# Patient Record
Sex: Female | Born: 1950
Health system: Southern US, Community
[De-identification: ages and names within clinical notes are randomized; demographics above are authoritative.]

## PROBLEM LIST (undated history)

## (undated) DIAGNOSIS — E039 Hypothyroidism, unspecified: Secondary | ICD-10-CM

## (undated) DIAGNOSIS — E079 Disorder of thyroid, unspecified: Secondary | ICD-10-CM

## (undated) DIAGNOSIS — I10 Essential (primary) hypertension: Secondary | ICD-10-CM

## (undated) HISTORY — PX: THYROID SURGERY: SHX805

## (undated) HISTORY — DX: Essential (primary) hypertension: I10

## (undated) HISTORY — PX: CERVICAL CONE BIOPSY: SUR198

## (undated) HISTORY — DX: Disorder of thyroid, unspecified: E07.9

---

## 2004-04-10 ENCOUNTER — Ambulatory Visit: Payer: Self-pay | Admitting: Obstetrics and Gynecology

## 2004-05-05 ENCOUNTER — Ambulatory Visit (HOSPITAL_COMMUNITY): Admission: RE | Admit: 2004-05-05 | Discharge: 2004-05-05 | Payer: Self-pay | Admitting: Neurosurgery

## 2004-05-25 ENCOUNTER — Emergency Department: Payer: Self-pay | Admitting: Emergency Medicine

## 2004-06-04 ENCOUNTER — Ambulatory Visit: Payer: Self-pay | Admitting: Family Medicine

## 2005-03-30 HISTORY — PX: BRAIN SURGERY: SHX531

## 2005-07-01 ENCOUNTER — Ambulatory Visit: Payer: Self-pay | Admitting: Obstetrics and Gynecology

## 2006-12-22 ENCOUNTER — Ambulatory Visit: Payer: Self-pay | Admitting: Obstetrics and Gynecology

## 2007-01-10 ENCOUNTER — Ambulatory Visit: Payer: Self-pay | Admitting: Obstetrics and Gynecology

## 2007-07-02 ENCOUNTER — Emergency Department: Payer: Self-pay | Admitting: Emergency Medicine

## 2008-02-15 ENCOUNTER — Ambulatory Visit: Payer: Self-pay | Admitting: Obstetrics and Gynecology

## 2009-02-05 ENCOUNTER — Ambulatory Visit: Payer: Self-pay | Admitting: Internal Medicine

## 2009-03-05 ENCOUNTER — Ambulatory Visit: Payer: Self-pay | Admitting: Obstetrics and Gynecology

## 2010-01-09 ENCOUNTER — Ambulatory Visit: Payer: Self-pay | Admitting: Obstetrics and Gynecology

## 2010-01-15 ENCOUNTER — Ambulatory Visit: Payer: Self-pay | Admitting: Neurology

## 2010-04-03 ENCOUNTER — Ambulatory Visit: Payer: Self-pay | Admitting: Obstetrics and Gynecology

## 2010-04-19 ENCOUNTER — Encounter: Payer: Self-pay | Admitting: Neurosurgery

## 2011-04-29 ENCOUNTER — Emergency Department: Payer: Self-pay | Admitting: Emergency Medicine

## 2011-04-29 LAB — CBC
HGB: 14 g/dL (ref 12.0–16.0)
MCH: 30 pg (ref 26.0–34.0)
Platelet: 214 10*3/uL (ref 150–440)
RBC: 4.65 10*6/uL (ref 3.80–5.20)
WBC: 4.7 10*3/uL (ref 3.6–11.0)

## 2011-04-29 LAB — COMPREHENSIVE METABOLIC PANEL
Bilirubin,Total: 0.4 mg/dL (ref 0.2–1.0)
Creatinine: 0.77 mg/dL (ref 0.60–1.30)
EGFR (African American): >60
EGFR (Non-African Amer.): >60
Glucose: 103 mg/dL — ABNORMAL HIGH (ref 65–99)
Osmolality: 284 (ref 275–301)
Potassium: 3.7 mmol/L (ref 3.5–5.1)
Sodium: 143 mmol/L (ref 136–145)
Total Protein: 8 g/dL (ref 6.4–8.2)

## 2011-04-29 LAB — URINALYSIS, COMPLETE
Bilirubin,UR: NEGATIVE
Ketone: NEGATIVE
Ph: 7 (ref 4.5–8.0)
WBC UR: 2 /HPF (ref 0–5)

## 2011-04-29 LAB — TROPONIN I: Troponin-I: <0.02 ng/mL

## 2011-04-29 LAB — LIPASE, BLOOD: Lipase: 138 U/L (ref 73–393)

## 2011-06-04 ENCOUNTER — Ambulatory Visit: Payer: Self-pay | Admitting: Obstetrics and Gynecology

## 2011-06-18 ENCOUNTER — Ambulatory Visit: Payer: Self-pay | Admitting: Obstetrics and Gynecology

## 2012-02-19 ENCOUNTER — Ambulatory Visit: Payer: Self-pay | Admitting: Internal Medicine

## 2012-03-28 ENCOUNTER — Ambulatory Visit: Payer: Self-pay | Admitting: Gastroenterology

## 2012-03-29 ENCOUNTER — Ambulatory Visit: Payer: Self-pay | Admitting: Gastroenterology

## 2012-03-31 LAB — PATHOLOGY REPORT

## 2012-05-05 ENCOUNTER — Ambulatory Visit: Payer: Self-pay | Admitting: Gastroenterology

## 2012-05-06 LAB — PATHOLOGY REPORT

## 2012-12-23 ENCOUNTER — Ambulatory Visit: Payer: Self-pay | Admitting: Gastroenterology

## 2013-01-02 ENCOUNTER — Ambulatory Visit: Payer: Self-pay | Admitting: Obstetrics and Gynecology

## 2013-02-09 ENCOUNTER — Ambulatory Visit: Payer: Self-pay | Admitting: Obstetrics and Gynecology

## 2013-09-07 ENCOUNTER — Ambulatory Visit: Payer: Self-pay | Admitting: Obstetrics and Gynecology

## 2013-10-30 ENCOUNTER — Emergency Department: Payer: Self-pay | Admitting: Emergency Medicine

## 2013-10-30 LAB — BASIC METABOLIC PANEL
Anion Gap: 5 — ABNORMAL LOW (ref 7–16)
BUN: 13 mg/dL (ref 7–18)
Calcium, Total: 8.8 mg/dL (ref 8.5–10.1)
Chloride: 109 mmol/L — ABNORMAL HIGH (ref 98–107)
Co2: 27 mmol/L (ref 21–32)
Creatinine: 1.06 mg/dL (ref 0.60–1.30)
GFR CALC NON AF AMER: 55 — AB
Glucose: 97 mg/dL (ref 65–99)
Osmolality: 281 (ref 275–301)
POTASSIUM: 3.4 mmol/L — AB (ref 3.5–5.1)
Sodium: 141 mmol/L (ref 136–145)

## 2013-10-30 LAB — PROTIME-INR
INR: 1
Prothrombin Time: 13.4 secs (ref 11.5–14.7)

## 2013-10-30 LAB — TROPONIN I
Troponin-I: <0.02 ng/mL
Troponin-I: <0.02 ng/mL

## 2013-10-30 LAB — CBC
HCT: 38.2 % (ref 35.0–47.0)
HGB: 13 g/dL (ref 12.0–16.0)
MCH: 29.9 pg (ref 26.0–34.0)
MCHC: 34 g/dL (ref 32.0–36.0)
MCV: 88 fL (ref 80–100)
Platelet: 189 10*3/uL (ref 150–440)
RBC: 4.34 10*6/uL (ref 3.80–5.20)
RDW: 12.6 % (ref 11.5–14.5)
WBC: 4.8 10*3/uL (ref 3.6–11.0)

## 2013-10-30 LAB — PRO B NATRIURETIC PEPTIDE: B-TYPE NATIURETIC PEPTID: 60 pg/mL (ref 0–125)

## 2013-10-30 LAB — D-DIMER(ARMC): D-DIMER: 358 ng/mL

## 2013-11-02 ENCOUNTER — Ambulatory Visit: Payer: Self-pay | Admitting: Unknown Physician Specialty

## 2013-12-26 ENCOUNTER — Ambulatory Visit: Payer: Self-pay | Admitting: Unknown Physician Specialty

## 2013-12-26 LAB — CALCIUM
CALCIUM: 8.6 mg/dL (ref 8.5–10.1)
Calcium, Total: 8.9 mg/dL (ref 8.5–10.1)

## 2013-12-27 LAB — CALCIUM: Calcium, Total: 8.9 mg/dL (ref 8.5–10.1)

## 2013-12-28 LAB — PATHOLOGY REPORT

## 2014-04-10 ENCOUNTER — Ambulatory Visit: Payer: Self-pay | Admitting: Obstetrics and Gynecology

## 2014-04-10 DIAGNOSIS — N63 Unspecified lump in breast: Secondary | ICD-10-CM | POA: Diagnosis not present

## 2014-04-10 DIAGNOSIS — R928 Other abnormal and inconclusive findings on diagnostic imaging of breast: Secondary | ICD-10-CM | POA: Diagnosis not present

## 2014-04-19 ENCOUNTER — Ambulatory Visit: Payer: Self-pay | Admitting: Obstetrics and Gynecology

## 2014-04-19 DIAGNOSIS — N63 Unspecified lump in breast: Secondary | ICD-10-CM | POA: Diagnosis not present

## 2014-07-20 DIAGNOSIS — R5381 Other malaise: Secondary | ICD-10-CM | POA: Diagnosis not present

## 2014-07-20 DIAGNOSIS — R1084 Generalized abdominal pain: Secondary | ICD-10-CM | POA: Diagnosis not present

## 2014-07-20 DIAGNOSIS — I10 Essential (primary) hypertension: Secondary | ICD-10-CM | POA: Diagnosis not present

## 2014-07-20 DIAGNOSIS — E784 Other hyperlipidemia: Secondary | ICD-10-CM | POA: Diagnosis not present

## 2014-07-21 NOTE — Op Note (Signed)
PATIENT NAME:  Suzanne Ritter, Suzanne Ritter MR#:  [id number] DATE OF BIRTH:  1950/06/05  DATE OF PROCEDURE:  12/26/2013  PREOPERATIVE DIAGNOSIS: Multinodular goiter.   POSTOPERATIVE DIAGNOSIS: Multinodular goiter.   SURGEON: Roena Malady, M.D.   ASSISTANT: Sammuel Hines. Richardson Landry, M.D.    OPERATION PERFORMED: Total thyroidectomy. Laryngeal nerve monitoring for 1.5 hours.   OPERATIVE FINDINGS: A large 3 cm nodule right lobe, 1 cm nodule left lobe.   DESCRIPTION OF PROCEDURE: Suzanne Ritter was seen and identified in the holding area, taken to the operating room and placed in the supine position. After general endotracheal anesthesia with a laryngeal nerve monitor, the patient was intubated with a laryngeal safe endotracheal tube. With this secured, the neck was gently extended. Incision line was marked overlying the thyroid mass. This was in a skin tension line. The neck was prepped and draped sterilely. A local anesthetic of 1% lidocaine and 1:100,000 epinephrine was used to inject along the incision line. A total of 5 mL was used. With the neck prepped and draped sterilely, a 15 blade was used to incise down to and through the platysma muscle. Hemostasis was achieved using the Bovie cautery. The strap muscles were identified and divided in the midline. Beginning on the right-hand side, the strap muscles were retracted laterally. The thyroid gland was easily identified. There were multiple feeding vessels which were divided using the Harmonic scalpel. The superior pole vessels were isolated and divided using the Harmonic scalpel. The gland was then peeled medially, or dissected medially. The superior and inferior parathyroid glands were identified and preserved on their vascular pedicles. The recurrent laryngeal nerve was identified in the tracheoesophageal groove. This was stimulated and left intact throughout the case.   With this completed, as the gland was medialized, there were several small bleeding vessels  which were divided using the Harmonic scalpel. Berry's ligament was released, and peeled off the trachea anteriorly. There was a large firm mass in the superior right pole, which remained intact.   With the right side dissected, the left side was then dissected. The superior pole vessels were isolated and divided using the Harmonic scalpel. The superior and inferior parathyroid glands were identified. As the left lobe was dissected medially, the superior and inferior parathyroid glands were identified and preserved on their vascular pedicles. Hence, the gland was dissected medially, the recurrent laryngeal nerve was identified in the tracheoesophageal groove. This was stimulated and remained intact throughout the case. As the left lobe was dissected over the anterior wall of the trachea, again Berry's ligament was released on the left. This allowed the gland to be removed in its entirety. With the gland removed, the stitch was placed in the right upper lobe. At the end of this dissection, both sides were restimulated and both recurrent laryngeal nerves were intact.   With this completed, the wound was copiously irrigated with saline. Any small bleeding points were cauterized using the microbipolar. A #7 TLS drain was brought out of the wound inferiorly. The strap muscles were reapproximated in the midline using 4-0 Vicryl. The platysmal layer was closed using 4-0 Vicryl. The subcutaneous tissues were closed using 4-0 Vicryl and the skin was closed using Dermabond.   The patient was then returned to anesthesia where he was awakened in the operating room and taken to the recovery room in stable condition.   CULTURES: None.   SPECIMENS: Total thyroid.   ESTIMATED BLOOD LOSS: Less than 20 mL.   ____________________________ Roena Malady, MD ctm:JT D:  12/26/2013 08:53:14 ET T: 12/26/2013 10:16:29 ET JOB#: [id number]  cc: Roena Malady, MD, <Dictator> Roena Malady MD ELECTRONICALLY  SIGNED 01/17/2014 8:31

## 2014-07-27 DIAGNOSIS — K2900 Acute gastritis without bleeding: Secondary | ICD-10-CM | POA: Diagnosis not present

## 2014-08-31 DIAGNOSIS — K2970 Gastritis, unspecified, without bleeding: Secondary | ICD-10-CM | POA: Diagnosis not present

## 2014-12-04 DIAGNOSIS — E041 Nontoxic single thyroid nodule: Secondary | ICD-10-CM | POA: Diagnosis not present

## 2014-12-04 DIAGNOSIS — K2970 Gastritis, unspecified, without bleeding: Secondary | ICD-10-CM | POA: Diagnosis not present

## 2015-01-10 ENCOUNTER — Ambulatory Visit (INDEPENDENT_AMBULATORY_CARE_PROVIDER_SITE_OTHER): Payer: Medicare Other | Admitting: Obstetrics and Gynecology

## 2015-01-10 ENCOUNTER — Encounter: Payer: Self-pay | Admitting: Obstetrics and Gynecology

## 2015-01-10 VITALS — BP 147/84 | HR 69 | Resp 14 | Ht 63.5 in | Wt 112.8 lb

## 2015-01-10 DIAGNOSIS — Z124 Encounter for screening for malignant neoplasm of cervix: Secondary | ICD-10-CM

## 2015-01-10 DIAGNOSIS — Z01419 Encounter for gynecological examination (general) (routine) without abnormal findings: Secondary | ICD-10-CM

## 2015-01-10 LAB — POCT URINALYSIS DIP (MANUAL ENTRY)
BILIRUBIN UA: NEGATIVE
Bilirubin, UA: NEGATIVE
Blood, UA: NEGATIVE
GLUCOSE UA: NEGATIVE
Leukocytes, UA: NEGATIVE
Nitrite, UA: NEGATIVE
PROTEIN UA: NEGATIVE
SPEC GRAV UA: 1.015
Urobilinogen, UA: 0.2
pH, UA: 6

## 2015-01-10 NOTE — Progress Notes (Signed)
ANNUAL PREVENTATIVE CARE GYN  ENCOUNTER NOTE  Subjective:       Suzanne Ritter is a 64 y.o. [phone] postmenopausal  female who presents to establish care, and for a routine annual gynecologic exam.  Previously seen at [facility]. The patient has no complaints today. The patient is sexually active. The patient is not taking hormone replacement therapy. Patient denies post-menopausal vaginal bleeding.. The patient wears seatbelts: yes. The patient participates in regular exercise: yes. Has the patient ever been transfused or tattooed?: no. The patient reports that there is not domestic violence in her life.   Gynecologic History No LMP recorded. Patient is postmenopausal. Last Pap: 2014. Results were: normal Last mammogram: 2016. Results were: normal Last colonoscopy: 2014, had 2 polyps removed. Due for repeat 3 years from last procedure. DEXA Scan: 5 years ago, reports normal scan (no osteoporosis or osteopenia)    Obstetric History OB History  Gravida Para Term Preterm AB SAB TAB Ectopic Multiple Living  4 2  2 2          # Outcome Date GA Lbr Len/2nd Weight Sex Delivery Anes PTL Lv  4 AB           3 AB           2 Preterm           1 Preterm               Past Medical History  Diagnosis Date  . Thyroid disease   . Hypertension     Past Surgical History  Procedure Laterality Date  . Cesarean section    . Brain surgery    . Thyroid surgery    . Cervical cone biopsy      No current outpatient prescriptions on file prior to visit.   No current facility-administered medications on file prior to visit.    Allergies  Allergen Reactions  . Naproxen Other (See Comments)    Other Reaction: GI Upset    Social History   Social History  . Marital Status: Married    Spouse Name: N/A  . Number of Children: N/A  . Years of Education: N/A   Occupational History  . Not on file.   Social History Main Topics  . Smoking status: Former Research scientist (life sciences)  .  Smokeless tobacco: Not on file  . Alcohol Use: No  . Drug Use: No  . Sexual Activity: Not on file   Other Topics Concern  . Not on file   Social History Narrative  . No narrative on file    No family history on file.  The following portions of the patient's history were reviewed and updated as appropriate: allergies, current medications, past family history, past medical history, past social history, past surgical history and problem list.  Review of Systems ROS Review of Systems - General ROS: negative for - chills, fatigue, fever, hot flashes, night sweats, weight gain or weight loss Psychological ROS: negative for - anxiety, decreased libido, depression, mood swings, physical abuse or sexual abuse Ophthalmic ROS: negative for - blurry vision, eye pain or loss of vision ENT ROS: negative for - headaches, hearing change, visual changes or vocal changes Allergy and Immunology ROS: negative for - hives, itchy/watery eyes or seasonal allergies Hematological and Lymphatic ROS: negative for - bleeding problems, bruising, swollen lymph nodes or weight loss Endocrine ROS: negative for - galactorrhea, hair pattern changes, hot flashes, malaise/lethargy, mood swings, palpitations, polydipsia/polyuria, skin changes, temperature intolerance or unexpected weight  changes Breast ROS: negative for - new or changing breast lumps or nipple discharge Respiratory ROS: negative for - cough or shortness of breath Cardiovascular ROS: negative for - chest pain, irregular heartbeat, palpitations or shortness of breath Gastrointestinal ROS: no abdominal pain, change in bowel habits, or black or bloody stools Genito-Urinary ROS: painful intercourse, vaginal dryness present; no dysuria, trouble voiding, or hematuria Musculoskeletal ROS: negative for - joint pain or joint stiffness Neurological ROS: negative for - bowel and bladder control changes Dermatological ROS: negative for rash and skin lesion changes    Objective:   BP 147/84 mmHg  Pulse 69  Resp 14  Ht 5' 3.5" (1.613 m)  Wt 112 lb 12.8 oz (51.166 kg)  BMI 19.67 kg/m2 CONSTITUTIONAL: Well-developed, well-nourished female in no acute distress.  PSYCHIATRIC: Normal mood and affect. Normal behavior. Normal judgment and thought content. [place]: Alert and oriented to person, place, and time. Normal muscle tone coordination. No cranial nerve deficit noted. HENT:  Normocephalic, atraumatic, External right and left ear normal. Oropharynx is clear and moist EYES: Conjunctivae and EOM are normal. Pupils are equal, round, and reactive to light. No scleral icterus.  NECK: Normal range of motion, supple, no masses.  Normal thyroid.  SKIN: Skin is warm and dry. No rash noted. Not diaphoretic. No erythema. No pallor. CARDIOVASCULAR: Normal heart rate noted, regular rhythm, no murmur. RESPIRATORY: Clear to auscultation bilaterally. Effort and breath sounds normal, no problems with respiration noted. BREASTS: Symmetric in size. No masses, skin changes, nipple drainage, or lymphadenopathy. ABDOMEN: Soft, normal bowel sounds, no distention noted.  No tenderness, rebound or guarding.  BLADDER: Normal PELVIC:  External Genitalia: Normal  BUS: Normal  Vagina: Mild vaginal atrophy present  Cervix: Flushed with vaginal wall  Uterus: Small 5-6 weeks, mobile, nontender  Adnexa: Normal, non-palpable, nontender  RV: External Exam NormaI  MUSCULOSKELETAL: Normal range of motion. No tenderness.  No cyanosis, clubbing, or edema.  2+ distal pulses. LYMPHATIC: No Axillary, Supraclavicular, or Inguinal Adenopathy.    Assessment:   Annual gynecologic examination 64 y.o. postmenopausal female Normal BMI Vaginal atrophy with dyspareunia  Plan:  Pap: Pap Co Test performed. Informed patient after age 25, paps no longer required if no h/o abnormal paps.  Mammogram: Not Ordered.  Up to date.  Stool Guaiac Testing:  Not Ordered.  Up to date on colonoscopy.   Labs: Has appointment with PCP next month, will have labs drawn then.  Routine preventative health maintenance measures emphasized: Exercise/Diet/Weight control and Alcohol/Substance use risks Discussed use of lubricants/vaginal moisturizers for dyspareunia.  If no relief, can return to discuss use of local estrogen therapy or Osphena.  Return to [place], MD

## 2015-01-15 LAB — PAP IG W/ RFLX HPV ASCU: PAP SMEAR COMMENT: 0

## 2015-04-02 DIAGNOSIS — E039 Hypothyroidism, unspecified: Secondary | ICD-10-CM | POA: Diagnosis not present

## 2015-04-02 DIAGNOSIS — I10 Essential (primary) hypertension: Secondary | ICD-10-CM | POA: Diagnosis not present

## 2015-04-02 DIAGNOSIS — E041 Nontoxic single thyroid nodule: Secondary | ICD-10-CM | POA: Diagnosis not present

## 2015-04-22 DIAGNOSIS — J029 Acute pharyngitis, unspecified: Secondary | ICD-10-CM | POA: Diagnosis not present

## 2015-04-22 DIAGNOSIS — J219 Acute bronchiolitis, unspecified: Secondary | ICD-10-CM | POA: Diagnosis not present

## 2015-07-01 DIAGNOSIS — E041 Nontoxic single thyroid nodule: Secondary | ICD-10-CM | POA: Diagnosis not present

## 2015-07-01 DIAGNOSIS — I10 Essential (primary) hypertension: Secondary | ICD-10-CM | POA: Diagnosis not present

## 2015-08-23 DIAGNOSIS — R5381 Other malaise: Secondary | ICD-10-CM | POA: Diagnosis not present

## 2015-08-23 DIAGNOSIS — I10 Essential (primary) hypertension: Secondary | ICD-10-CM | POA: Diagnosis not present

## 2015-08-23 DIAGNOSIS — E784 Other hyperlipidemia: Secondary | ICD-10-CM | POA: Diagnosis not present

## 2015-09-05 DIAGNOSIS — I10 Essential (primary) hypertension: Secondary | ICD-10-CM | POA: Diagnosis not present

## 2015-09-05 DIAGNOSIS — E041 Nontoxic single thyroid nodule: Secondary | ICD-10-CM | POA: Diagnosis not present

## 2015-11-29 ENCOUNTER — Other Ambulatory Visit: Payer: Self-pay

## 2016-01-09 DIAGNOSIS — Z2821 Immunization not carried out because of patient refusal: Secondary | ICD-10-CM | POA: Diagnosis not present

## 2016-01-09 DIAGNOSIS — I10 Essential (primary) hypertension: Secondary | ICD-10-CM | POA: Diagnosis not present

## 2016-01-09 DIAGNOSIS — E041 Nontoxic single thyroid nodule: Secondary | ICD-10-CM | POA: Diagnosis not present

## 2016-01-16 ENCOUNTER — Encounter: Payer: Medicare Other | Admitting: Obstetrics and Gynecology

## 2016-05-11 DIAGNOSIS — I10 Essential (primary) hypertension: Secondary | ICD-10-CM | POA: Diagnosis not present

## 2016-05-11 DIAGNOSIS — E041 Nontoxic single thyroid nodule: Secondary | ICD-10-CM | POA: Diagnosis not present

## 2016-05-14 ENCOUNTER — Emergency Department
Admission: EM | Admit: 2016-05-14 | Discharge: 2016-05-14 | Disposition: A | Discharge status: Home / Self Care | Payer: Medicare Other | Attending: Emergency Medicine | Admitting: Emergency Medicine

## 2016-05-14 ENCOUNTER — Encounter: Payer: Self-pay | Admitting: Emergency Medicine

## 2016-05-14 DIAGNOSIS — I10 Essential (primary) hypertension: Secondary | ICD-10-CM | POA: Diagnosis not present

## 2016-05-14 DIAGNOSIS — R519 Headache, unspecified: Secondary | ICD-10-CM

## 2016-05-14 DIAGNOSIS — R51 Headache: Secondary | ICD-10-CM

## 2016-05-14 DIAGNOSIS — Z87891 Personal history of nicotine dependence: Secondary | ICD-10-CM | POA: Insufficient documentation

## 2016-05-14 LAB — BASIC METABOLIC PANEL
Anion gap: 7 (ref 5–15)
BUN: 18 mg/dL (ref 6–20)
CO2: 29 mmol/L (ref 22–32)
CREATININE: 1.04 mg/dL — AB (ref 0.44–1.00)
Calcium: 8.8 mg/dL — ABNORMAL LOW (ref 8.9–10.3)
Chloride: 104 mmol/L (ref 101–111)
GFR calc Af Amer: >60 mL/min (ref >60)
GFR, EST NON AFRICAN AMERICAN: 54 mL/min — AB (ref >60)
Glucose, Bld: 104 mg/dL — ABNORMAL HIGH (ref 65–99)
Potassium: 3.7 mmol/L (ref 3.5–5.1)
Sodium: 140 mmol/L (ref 135–145)

## 2016-05-14 LAB — URINALYSIS, COMPLETE (UACMP) WITH MICROSCOPIC
Bacteria, UA: NONE SEEN
Bilirubin Urine: NEGATIVE
Glucose, UA: NEGATIVE mg/dL
Ketones, ur: NEGATIVE mg/dL
NITRITE: NEGATIVE
PH: 7 (ref 5.0–8.0)
Protein, ur: NEGATIVE mg/dL
SPECIFIC GRAVITY, URINE: 1.006 (ref 1.005–1.030)
Squamous Epithelial / LPF: NONE SEEN

## 2016-05-14 LAB — CBC
HCT: 41.6 % (ref 35.0–47.0)
HEMOGLOBIN: 14.1 g/dL (ref 12.0–16.0)
MCH: 30.8 pg (ref 26.0–34.0)
MCHC: 34 g/dL (ref 32.0–36.0)
MCV: 90.7 fL (ref 80.0–100.0)
PLATELETS: 183 10*3/uL (ref 150–440)
RBC: 4.58 MIL/uL (ref 3.80–5.20)
RDW: 12.8 % (ref 11.5–14.5)
WBC: 5.4 10*3/uL (ref 3.6–11.0)

## 2016-05-14 LAB — TROPONIN I: Troponin I: <0.03 ng/mL (ref <0.03)

## 2016-05-14 MED ORDER — HYDROCHLOROTHIAZIDE 25 MG PO TABS: 25.0000 mg | ORAL_TABLET | Freq: Every day | ORAL | 1 refills | Status: DC

## 2016-05-14 MED ORDER — HYDROCHLOROTHIAZIDE 25 MG PO TABS: 25.0000 mg | ORAL_TABLET | Freq: Every day | ORAL | Status: DC

## 2016-05-14 MED ORDER — HYDROCHLOROTHIAZIDE 25 MG PO TABS
25.0000 mg | ORAL_TABLET | Freq: Once | ORAL | Status: AC
  Administered 2016-05-14: 25 mg via ORAL
  Filled 2016-05-14: qty 1

## 2016-05-14 NOTE — ED Triage Notes (Signed)
Pt ambulatory to triage with steady gait, no distress noted. Pt reports when she woke this morning she felt weak and dizzy, sts she feels her BP is elevated. Pt has HX of hypertension, denies current medication for such.

## 2016-05-14 NOTE — ED Provider Notes (Signed)
[facility] Emergency Department Provider Note   ____________________________________________    I have reviewed the triage vital signs and the nursing notes.   HISTORY  Chief Complaint Hypertension     HPI Suzanne Ritter is a 66 y.o. female who presents with concerns of high blood pressure. Patient reports she woke up this morning with a mild global headache. She reports it was aching in nature. She denies neuro deficits. She reports she took her blood pressure and found it to be high and came concerned and came to the emergency she reports her head feels much better now without intervention. She reports she used to be on hydrochlorothiazide but has been taken off the medication because of diet and exercise. She saw her physician one week ago and her blood pressure was elevated at that time   Past Medical History:  Diagnosis Date  . Hypertension   . Thyroid disease     There are no active problems to display for this patient.   Past Surgical History:  Procedure Laterality Date  . BRAIN SURGERY    . CERVICAL CONE BIOPSY    . CESAREAN SECTION    . THYROID SURGERY      Prior to Admission medications   Medication Sig Start Date End Date Taking? Authorizing Provider  hydrochlorothiazide (HYDRODIURIL) 25 MG tablet Take 1 tablet (25 mg total) by mouth daily. 05/14/16   Lavonia Drafts, MD  levothyroxine (SYNTHROID, LEVOTHROID) 100 MCG tablet  12/05/14   Historical Provider, MD     Allergies Naproxen  History reviewed. No pertinent family history.  Social History Social History  Substance Use Topics  . Smoking status: Former Research scientist (life sciences)  . Smokeless tobacco: Never Used  . Alcohol use No    Review of Systems  Constitutional: No fever/chills Eyes: No visual changes.   Cardiovascular: Denies chest pain. Respiratory: Denies shortness of breath. Gastrointestinal: No abdominal pain.  No nausea, no vomiting.   Genitourinary: No dysuria or  frequency  Skin: Negative for rash. Neurological: Negative for weakness  10-point ROS otherwise negative.  ____________________________________________   PHYSICAL EXAM:  VITAL SIGNS: ED Triage Vitals [05/14/16 0514]  Enc Vitals Group     BP (!) 167/100     Pulse Rate 71     Resp 15     Temp 98 F (36.7 C)     Temp Source Oral     SpO2 98 %     Weight 122 lb (55.3 kg)     Height 5\' 3"  (1.6 m)     Head Circumference      Peak Flow      Pain Score      Pain Loc      Pain Edu?      Excl. in [place]?     Constitutional: Alert and oriented. No acute distress. Pleasant and interactive Eyes: Conjunctivae are normal. PERRLA, EOMI  Nose: No congestion/rhinnorhea. Mouth/Throat: Mucous membranes are moist.    Cardiovascular: Normal rate, regular rhythm. Grossly normal heart sounds.  Good peripheral circulation. Respiratory: Normal respiratory effort.  No retractions. Lungs CTAB. Gastrointestinal: Soft and nontender. No distention.  No CVA tenderness. Genitourinary: deferred Musculoskeletal: No lower extremity tenderness nor edema.  Warm and well perfused Neurologic:  Normal speech and language. No gross focal neurologic deficits are appreciated. Cranial nerves II through XII normal Skin:  Skin is warm, dry and intact. No rash noted. Psychiatric: Mood and affect are normal. Speech and behavior are normal.  ____________________________________________  LABS (all labs ordered are listed, but only abnormal results are displayed)  Labs Reviewed  BASIC METABOLIC PANEL - Abnormal; Notable for the following:       Result Value   Glucose, Bld 104 (*)    Creatinine, Ser 1.04 (*)    Calcium 8.8 (*)    GFR calc non Af Amer 54 (*)    All other components within normal limits  URINALYSIS, COMPLETE (UACMP) WITH MICROSCOPIC - Abnormal; Notable for the following:    Color, Urine COLORLESS (*)    APPearance CLEAR (*)    Hgb urine dipstick SMALL (*)    Leukocytes, UA SMALL (*)    All  other components within normal limits  CBC  TROPONIN I  CBG MONITORING, ED   ____________________________________________  EKG  ED ECG REPORT I, Lavonia Drafts, the attending physician, personally viewed and interpreted this ECG.  Date: 05/14/2016  Rhythm: normal sinus rhythm QRS Axis: normal Intervals: normal ST/T Wave abnormalities: normal Conduction Disturbances: none Narrative Interpretation: unremarkable  ____________________________________________  RADIOLOGY  None ____________________________________________   PROCEDURES  Procedure(s) performed: No    Critical Care performed: No ____________________________________________   INITIAL IMPRESSION / ASSESSMENT AND PLAN / ED COURSE  Pertinent labs & imaging results that were available during my care of the patient were reviewed by me and considered in my medical decision making (see chart for details).  Patient is asymptomatic in the ED. She no longer has a headache. She is well-appearing and her exam is normal. Lab work is unremarkable. Her blood pressure is elevated. We will restart her hydrochlorothiazide and give her first dose in the ED. She will follow-up with her PCP in one week to recheck her blood pressure. She knows to return if any change in her symptoms    ____________________________________________   FINAL CLINICAL IMPRESSION(S) / ED DIAGNOSES  Final diagnoses:  Essential hypertension  Acute nonintractable headache, unspecified headache type      NEW MEDICATIONS STARTED DURING THIS VISIT:  Discharge Medication List as of 05/14/2016  6:32 AM    START taking these medications   Details  hydrochlorothiazide (HYDRODIURIL) 25 MG tablet Take 1 tablet (25 mg total) by mouth daily., Starting Thu 05/14/2016, Print         Note:  This document was prepared using Dragon voice recognition software and may include unintentional dictation errors.    Lavonia Drafts, MD 05/14/16 [phone]

## 2016-06-25 DIAGNOSIS — I10 Essential (primary) hypertension: Secondary | ICD-10-CM | POA: Diagnosis not present

## 2016-06-25 DIAGNOSIS — M25562 Pain in left knee: Secondary | ICD-10-CM | POA: Diagnosis not present

## 2016-06-25 DIAGNOSIS — M76892 Other specified enthesopathies of left lower limb, excluding foot: Secondary | ICD-10-CM | POA: Diagnosis not present

## 2016-06-25 DIAGNOSIS — E041 Nontoxic single thyroid nodule: Secondary | ICD-10-CM | POA: Diagnosis not present

## 2016-07-21 DIAGNOSIS — E041 Nontoxic single thyroid nodule: Secondary | ICD-10-CM | POA: Diagnosis not present

## 2016-07-21 DIAGNOSIS — M25562 Pain in left knee: Secondary | ICD-10-CM | POA: Diagnosis not present

## 2016-07-21 DIAGNOSIS — M76892 Other specified enthesopathies of left lower limb, excluding foot: Secondary | ICD-10-CM | POA: Diagnosis not present

## 2016-07-21 DIAGNOSIS — I10 Essential (primary) hypertension: Secondary | ICD-10-CM | POA: Diagnosis not present

## 2016-09-28 DIAGNOSIS — E041 Nontoxic single thyroid nodule: Secondary | ICD-10-CM | POA: Diagnosis not present

## 2016-09-28 DIAGNOSIS — B029 Zoster without complications: Secondary | ICD-10-CM | POA: Diagnosis not present

## 2016-10-13 DIAGNOSIS — I83009 Varicose veins of unspecified lower extremity with ulcer of unspecified site: Secondary | ICD-10-CM | POA: Diagnosis not present

## 2016-10-13 DIAGNOSIS — L97901 Non-pressure chronic ulcer of unspecified part of unspecified lower leg limited to breakdown of skin: Secondary | ICD-10-CM | POA: Diagnosis not present

## 2016-10-13 DIAGNOSIS — M76892 Other specified enthesopathies of left lower limb, excluding foot: Secondary | ICD-10-CM | POA: Diagnosis not present

## 2016-10-13 DIAGNOSIS — I10 Essential (primary) hypertension: Secondary | ICD-10-CM | POA: Diagnosis not present

## 2016-10-16 DIAGNOSIS — Z86010 Personal history of colonic polyps: Secondary | ICD-10-CM | POA: Diagnosis not present

## 2016-10-26 ENCOUNTER — Encounter
Admission: RE | Disposition: A | Discharge status: Home / Self Care | Payer: Self-pay | Source: Ambulatory Visit | Attending: Gastroenterology

## 2016-10-26 ENCOUNTER — Ambulatory Visit: Payer: Medicare Other | Admitting: Anesthesiology

## 2016-10-26 ENCOUNTER — Encounter: Payer: Self-pay | Admitting: *Deleted

## 2016-10-26 ENCOUNTER — Ambulatory Visit
Admission: RE | Admit: 2016-10-26 | Discharge: 2016-10-26 | Disposition: A | Discharge status: Home / Self Care | Payer: Medicare Other | Source: Ambulatory Visit | Attending: Gastroenterology | Admitting: Gastroenterology

## 2016-10-26 DIAGNOSIS — Z86010 Personal history of colonic polyps: Secondary | ICD-10-CM | POA: Insufficient documentation

## 2016-10-26 DIAGNOSIS — Z1211 Encounter for screening for malignant neoplasm of colon: Secondary | ICD-10-CM | POA: Diagnosis not present

## 2016-10-26 DIAGNOSIS — E079 Disorder of thyroid, unspecified: Secondary | ICD-10-CM | POA: Diagnosis not present

## 2016-10-26 DIAGNOSIS — D120 Benign neoplasm of cecum: Secondary | ICD-10-CM | POA: Diagnosis not present

## 2016-10-26 DIAGNOSIS — Z79899 Other long term (current) drug therapy: Secondary | ICD-10-CM | POA: Insufficient documentation

## 2016-10-26 DIAGNOSIS — D123 Benign neoplasm of transverse colon: Secondary | ICD-10-CM | POA: Insufficient documentation

## 2016-10-26 DIAGNOSIS — D127 Benign neoplasm of rectosigmoid junction: Secondary | ICD-10-CM | POA: Insufficient documentation

## 2016-10-26 DIAGNOSIS — K635 Polyp of colon: Secondary | ICD-10-CM | POA: Diagnosis not present

## 2016-10-26 DIAGNOSIS — Z888 Allergy status to other drugs, medicaments and biological substances status: Secondary | ICD-10-CM | POA: Diagnosis not present

## 2016-10-26 HISTORY — PX: COLONOSCOPY WITH PROPOFOL: SHX5780

## 2016-10-26 SURGERY — COLONOSCOPY WITH PROPOFOL
Anesthesia: General

## 2016-10-26 MED ORDER — PROPOFOL 10 MG/ML IV BOLUS
INTRAVENOUS | Status: AC
  Filled 2016-10-26: qty 20

## 2016-10-26 MED ORDER — SODIUM CHLORIDE 0.9 % IV SOLN
INTRAVENOUS | Status: DC
  Administered 2016-10-26: 1000 mL via INTRAVENOUS
  Administered 2016-10-26: 12:00:00 via INTRAVENOUS

## 2016-10-26 MED ORDER — SODIUM CHLORIDE 0.9 % IV SOLN: INTRAVENOUS | Status: DC

## 2016-10-26 MED ORDER — PROPOFOL 500 MG/50ML IV EMUL
INTRAVENOUS | Status: AC
  Filled 2016-10-26: qty 50

## 2016-10-26 MED ORDER — PROPOFOL 500 MG/50ML IV EMUL
INTRAVENOUS | Status: DC | PRN
  Administered 2016-10-26: 150 ug/kg/min via INTRAVENOUS

## 2016-10-26 MED ORDER — PROPOFOL 10 MG/ML IV BOLUS
INTRAVENOUS | Status: DC | PRN
  Administered 2016-10-26: 50 mg via INTRAVENOUS

## 2016-10-26 MED ORDER — PHENYLEPHRINE HCL 10 MG/ML IJ SOLN
INTRAMUSCULAR | Status: DC | PRN
  Administered 2016-10-26 (×2): 100 ug via INTRAVENOUS

## 2016-10-26 NOTE — Transfer of Care (Signed)
Immediate Anesthesia Transfer of Care Note  Patient: Suzanne Ritter  Procedure(s) Performed: Procedure(s): COLONOSCOPY WITH PROPOFOL (N/A)  Patient Location: PACU  Anesthesia Type:General  Level of Consciousness: sedated  Airway & Oxygen Therapy: Patient Spontanous Breathing and Patient connected to nasal cannula oxygen  Post-op Assessment: Report given to RN and Post -op Vital signs reviewed and stable  Post vital signs: Reviewed and stable  Last Vitals:  Vitals:   10/26/16 1154  BP: 127/81  Pulse: 79  Resp: 20  Temp: 36.5 C    Last Pain:  Vitals:   10/26/16 1154  TempSrc: Tympanic         Complications: No apparent anesthesia complications

## 2016-10-26 NOTE — Anesthesia Preprocedure Evaluation (Signed)
Anesthesia Evaluation  Patient identified by MRN, date of birth, ID band Patient awake    Reviewed: Allergy & Precautions, NPO status , Patient's Chart, lab work & pertinent test results  History of Anesthesia Complications Negative for: history of anesthetic complications  Airway Mallampati: II  TM Distance: >3 FB Neck ROM: Full    Dental no notable dental hx.    Pulmonary neg pulmonary ROS, neg sleep apnea, neg COPD,    breath sounds clear to auscultation- rhonchi (-) wheezing      Cardiovascular Exercise Tolerance: Good hypertension, Pt. on medications (-) CAD, (-) Past MI and (-) Cardiac Stents  Rhythm:Regular Rate:Normal - Systolic murmurs and - Diastolic murmurs    Neuro/Psych negative neurological ROS  negative psych ROS   GI/Hepatic negative GI ROS, Neg liver ROS,   Endo/Other  negative endocrine ROSneg diabetesHypothyroidism   Renal/GU negative Renal ROS     Musculoskeletal negative musculoskeletal ROS (+)   Abdominal (+) - obese,   Peds  Hematology negative hematology ROS (+)   Anesthesia Other Findings Past Medical History: No date: Hypertension No date: Thyroid disease   Reproductive/Obstetrics                             Anesthesia Physical Anesthesia Plan  ASA: II  Anesthesia Plan: General   Post-op Pain Management:    Induction: Intravenous  PONV Risk Score and Plan: 2 and Propofol infusion  Airway Management Planned: Natural Airway  Additional Equipment:   Intra-op Plan:   Post-operative Plan:   Informed Consent: I have reviewed the patients History and Physical, chart, labs and discussed the procedure including the risks, benefits and alternatives for the proposed anesthesia with the patient or authorized representative who has indicated his/her understanding and acceptance.   Dental advisory given  Plan Discussed with: CRNA and  Anesthesiologist  Anesthesia Plan Comments:         Anesthesia Quick Evaluation

## 2016-10-26 NOTE — H&P (Signed)
Outpatient short stay form Pre-procedure 10/26/2016 12:24 PM Lollie Sails MD  Primary Physician:  Dr Cletis [place]  Reason for visit:  Colonoscopy  History of present illness:  Patient is a 66 year old female with personal history of adenomatous colon polyps. She had a large polyp in the transverse colon which was removed and marked with ink however on repeat check was found to have some residual tissue there which was removed. That was adenomatous as well. She is presenting today for colonoscopy to recheck this area as well as the rest of the colon. He tolerated her prep well. She takes an occasional Excedrin but not recently. She takes no other aspirin products or blood thinning agents.    Current Facility-Administered Medications:  .  0.9 %  sodium chloride infusion, , Intravenous, Continuous, Lollie Sails, MD, Last Rate: 20 mL/hr at 10/26/16 1213, 1,000 mL at 10/26/16 1213 .  0.9 %  sodium chloride infusion, , Intravenous, Continuous, Lollie Sails, MD  Prescriptions Prior to Admission  Medication Sig Dispense Refill Last Dose  . levothyroxine (SYNTHROID, LEVOTHROID) 100 MCG tablet   1 10/26/2016 at 0600  . lisinopril-hydrochlorothiazide (PRINZIDE,ZESTORETIC) 10-12.5 MG tablet Take 1 tablet by mouth daily.   10/26/2016 at 0800  . Sod Picosulfate-Mag Ox-Cit Acd 10-3.5-12 MG-GM-GM PACK Take by mouth.     . [DISCONTINUED] hydrochlorothiazide (HYDRODIURIL) 25 MG tablet Take 1 tablet (25 mg total) by mouth daily. 30 tablet 1      Allergies  Allergen Reactions  . Naproxen Other (See Comments)    Other Reaction: GI Upset     Past Medical History:  Diagnosis Date  . Hypertension   . Thyroid disease     Review of systems:      Physical Exam    Heart and lungs: Regular rate and rhythm without rub or gallop, lungs are bilaterally clear.    HEENT: normal cephalic atraumatic eyes are anicteric    Other:     Pertinant exam for procedure: Soft nontender nondistended  bowel sounds positive normoactive.    Planned proceedures: Colonoscopy and indicated procedures. I have discussed the risks benefits and complications of procedures to include not limited to bleeding, infection, perforation and the risk of sedation and the patient wishes to proceed.    Lollie Sails, MD Gastroenterology 10/26/2016  12:24 PM

## 2016-10-26 NOTE — Anesthesia Post-op Follow-up Note (Cosign Needed)
Anesthesia QCDR form completed.        

## 2016-10-26 NOTE — Op Note (Signed)
[facility] Gastroenterology Patient Name: Suzanne Ritter Procedure Date: 10/26/2016 12:16 PM MRN: [id number] Account #: [id number] Date of Birth: 1950-05-24 Admit Type: Outpatient Age: 66 Room: [facility] ENDO ROOM 1 Gender: Female Note Status: Finalized Procedure:            Colonoscopy Indications:          Personal history of colonic polyps Providers:            Lollie Sails, MD Referring MD:         Cletis Athens, MD (Referring MD) Medicines:            Monitored Anesthesia Care Complications:        No immediate complications. Procedure:            Pre-Anesthesia Assessment:                       - ASA Grade Assessment: II - A patient with mild                        systemic disease.                       After obtaining informed consent, the colonoscope was                        passed under direct vision. Throughout the procedure,                        the patient's blood pressure, pulse, and oxygen                        saturations were monitored continuously. The                        Colonoscope was introduced through the anus and                        advanced to the the cecum, identified by appendiceal                        orifice and ileocecal valve. The colonoscopy was                        unusually difficult due to poor bowel prep, significant                        looping and a tortuous colon. Successful completion of                        the procedure was aided by changing the patient to a                        supine position, changing the patient to a prone                        position, using manual pressure, withdrawing and                        reinserting the scope and lavage. The patient tolerated  the procedure well. The quality of the bowel                        preparation was fair. Findings:      A 2 mm polyp was found in the recto-sigmoid colon. The polyp was       sessile. The polyp was  removed with a cold biopsy forceps. Resection and       retrieval were complete.      A tattoo was seen in the transverse colon. A post-polypectomy scar was       found at the tattoo site. NBI normal. This was biopsied with a cold       forceps for histology. No evidence of polyp recurrance grossly.      Two sessile polyps were found in the hepatic flexure. The polyps were 1       to 2 mm in size. These polyps were removed with a cold biopsy forceps.       Resection and retrieval were complete.      A 8 mm polyp was found in the cecum. The polyp was sessile. The polyp       was removed with a cold snare. The polyp was removed with a lift and cut       technique using a cold snare. The polyp was removed with a piecemeal       technique using a cold snare. Resection and retrieval were complete. Impression:           - Preparation of the colon was fair.                       - One 2 mm polyp at the recto-sigmoid colon, removed                        with a cold biopsy forceps. Resected and retrieved.                       - A tattoo was seen in the transverse colon. A                        post-polypectomy scar was found at the tattoo site.                        Biopsied.                       - Two 1 to 2 mm polyps at the hepatic flexure, removed                        with a cold biopsy forceps. Resected and retrieved.                       - One 8 mm polyp in the cecum, removed with a cold                        snare, removed using lift and cut and a cold snare and                        removed piecemeal using a cold snare. Resected and  retrieved. Recommendation:       - Await pathology results.                       - Telephone GI clinic for pathology results in 1 week. Procedure Code(s):    --- Professional ---                       [phone], Colonoscopy, flexible; with removal of tumor(s),                        polyp(s), or other lesion(s) by snare technique                        45380, 89, Colonoscopy, flexible; with biopsy, single                        or multiple Diagnosis Code(s):    --- Professional ---                       D12.7, Benign neoplasm of rectosigmoid junction                       D12.0, Benign neoplasm of cecum                       D12.3, Benign neoplasm of transverse colon (hepatic                        flexure or splenic flexure)                       Z86.010, Personal history of colonic polyps CPT copyright 2016 American Medical Association. All rights reserved. The codes documented in this report are preliminary and upon coder review may  be revised to meet current compliance requirements. Lollie Sails, MD 10/26/2016 1:41:14 PM This report has been signed electronically. Number of Addenda: 0 Note Initiated On: 10/26/2016 12:16 PM Scope Withdrawal Time: 0 hours 11 minutes 14 seconds  Total Procedure Duration: 0 hours 59 minutes 50 seconds       [facility]

## 2016-10-27 ENCOUNTER — Encounter: Payer: Self-pay | Admitting: Gastroenterology

## 2016-10-27 LAB — SURGICAL PATHOLOGY

## 2016-10-27 NOTE — Anesthesia Postprocedure Evaluation (Signed)
Anesthesia Post Note  Patient: Kashae Carstens Baney  Procedure(s) Performed: Procedure(s) (LRB): COLONOSCOPY WITH PROPOFOL (N/A)  Patient location during evaluation: Endoscopy Anesthesia Type: General Level of consciousness: awake and alert and oriented Pain management: pain level controlled Vital Signs Assessment: post-procedure vital signs reviewed and stable Respiratory status: spontaneous breathing, nonlabored ventilation and respiratory function stable Cardiovascular status: blood pressure returned to baseline and stable Postop Assessment: no signs of nausea or vomiting Anesthetic complications: no     Last Vitals:  Vitals:   10/26/16 1403 10/26/16 1413  BP: 120/85 111/68  Pulse: (!) 56 66  Resp: 16 18  Temp:      Last Pain:  Vitals:   10/26/16 1343  TempSrc: Tympanic                 Laurel Harnden

## 2017-02-15 DIAGNOSIS — E041 Nontoxic single thyroid nodule: Secondary | ICD-10-CM | POA: Diagnosis not present

## 2017-02-15 DIAGNOSIS — I10 Essential (primary) hypertension: Secondary | ICD-10-CM | POA: Diagnosis not present

## 2017-02-15 DIAGNOSIS — M25562 Pain in left knee: Secondary | ICD-10-CM | POA: Diagnosis not present

## 2017-02-15 DIAGNOSIS — M76892 Other specified enthesopathies of left lower limb, excluding foot: Secondary | ICD-10-CM | POA: Diagnosis not present

## 2017-06-15 DIAGNOSIS — I10 Essential (primary) hypertension: Secondary | ICD-10-CM | POA: Diagnosis not present

## 2017-06-15 DIAGNOSIS — E041 Nontoxic single thyroid nodule: Secondary | ICD-10-CM | POA: Diagnosis not present

## 2017-06-15 DIAGNOSIS — M76892 Other specified enthesopathies of left lower limb, excluding foot: Secondary | ICD-10-CM | POA: Diagnosis not present

## 2017-06-15 DIAGNOSIS — M25562 Pain in left knee: Secondary | ICD-10-CM | POA: Diagnosis not present

## 2017-06-17 DIAGNOSIS — E7849 Other hyperlipidemia: Secondary | ICD-10-CM | POA: Diagnosis not present

## 2017-06-17 DIAGNOSIS — R5381 Other malaise: Secondary | ICD-10-CM | POA: Diagnosis not present

## 2017-06-17 DIAGNOSIS — E034 Atrophy of thyroid (acquired): Secondary | ICD-10-CM | POA: Diagnosis not present

## 2017-06-17 DIAGNOSIS — I10 Essential (primary) hypertension: Secondary | ICD-10-CM | POA: Diagnosis not present

## 2017-06-19 ENCOUNTER — Emergency Department: Payer: Medicare Other

## 2017-06-19 ENCOUNTER — Other Ambulatory Visit: Payer: Self-pay

## 2017-06-19 ENCOUNTER — Encounter: Payer: Self-pay | Admitting: Emergency Medicine

## 2017-06-19 ENCOUNTER — Emergency Department
Admission: EM | Admit: 2017-06-19 | Discharge: 2017-06-19 | Disposition: A | Discharge status: Home / Self Care | Payer: Medicare Other | Attending: Emergency Medicine | Admitting: Emergency Medicine

## 2017-06-19 DIAGNOSIS — J0110 Acute frontal sinusitis, unspecified: Secondary | ICD-10-CM | POA: Insufficient documentation

## 2017-06-19 DIAGNOSIS — R05 Cough: Secondary | ICD-10-CM | POA: Diagnosis present

## 2017-06-19 DIAGNOSIS — B9789 Other viral agents as the cause of diseases classified elsewhere: Secondary | ICD-10-CM | POA: Diagnosis not present

## 2017-06-19 DIAGNOSIS — Z79899 Other long term (current) drug therapy: Secondary | ICD-10-CM | POA: Insufficient documentation

## 2017-06-19 DIAGNOSIS — I10 Essential (primary) hypertension: Secondary | ICD-10-CM | POA: Diagnosis not present

## 2017-06-19 DIAGNOSIS — J069 Acute upper respiratory infection, unspecified: Secondary | ICD-10-CM | POA: Diagnosis not present

## 2017-06-19 LAB — INFLUENZA PANEL BY PCR (TYPE A & B)
INFLAPCR: NEGATIVE
Influenza B By PCR: NEGATIVE

## 2017-06-19 MED ORDER — FLUTICASONE PROPIONATE 50 MCG/ACT NA SUSP: 1.0000 | Freq: Two times a day (BID) | NASAL | 0 refills | Status: DC

## 2017-06-19 MED ORDER — CETIRIZINE HCL 10 MG PO TABS: 10.0000 mg | ORAL_TABLET | Freq: Every day | ORAL | 0 refills | Status: DC

## 2017-06-19 MED ORDER — PROMETHAZINE HCL 25 MG/ML IJ SOLN
12.5000 mg | Freq: Once | INTRAMUSCULAR | Status: AC
  Administered 2017-06-19: 12.5 mg via INTRAMUSCULAR
  Filled 2017-06-19: qty 1

## 2017-06-19 MED ORDER — DEXAMETHASONE SODIUM PHOSPHATE 10 MG/ML IJ SOLN
10.0000 mg | Freq: Once | INTRAMUSCULAR | Status: AC
  Administered 2017-06-19: 10 mg via INTRAMUSCULAR
  Filled 2017-06-19: qty 1

## 2017-06-19 MED ORDER — DIPHENHYDRAMINE HCL 50 MG/ML IJ SOLN
50.0000 mg | Freq: Once | INTRAMUSCULAR | Status: AC
  Administered 2017-06-19: 50 mg via INTRAMUSCULAR
  Filled 2017-06-19: qty 1

## 2017-06-19 MED ORDER — PROMETHAZINE HCL 25 MG/ML IJ SOLN: 25.0000 mg | Freq: Once | INTRAMUSCULAR | Status: DC

## 2017-06-19 MED ORDER — PSEUDOEPH-BROMPHEN-DM 30-2-10 MG/5ML PO SYRP: 10.0000 mL | ORAL_SOLUTION | Freq: Four times a day (QID) | ORAL | 0 refills | Status: DC | PRN

## 2017-06-19 MED ORDER — AMOXICILLIN-POT CLAVULANATE 875-125 MG PO TABS: 1.0000 | ORAL_TABLET | Freq: Two times a day (BID) | ORAL | 0 refills | Status: DC

## 2017-06-19 NOTE — ED Provider Notes (Signed)
[facility] Emergency Department Provider Note  ____________________________________________  Time seen: Approximately 6:28 PM  I have reviewed the triage vital signs and the nursing notes.   HISTORY  Chief Complaint flu like symptoms    HPI Suzanne Ritter is a 67 y.o. female resents the emergency department complaining of 3-day history of nasal congestion, sore throat, body aches, cough.  Patient reports that symptoms began with nasal congestion, sore throat.  They have progressed to include the other symptoms.  Patient is taken Tylenol at home.  Patient denies any headache, visual changes, neck pain or stiffness, chest pain, shortness of breath, abdominal pain, nausea vomiting, diarrhea or constipation.  Patient does endorse sinus pressure, ear fullness in addition to other symptoms.  No other medications.  No other complaints at this time.  Patient has a history of hypertension and thyroid disease but denies any complaints with chronic medical problems.  Past Medical History:  Diagnosis Date  . Hypertension   . Thyroid disease     There are no active problems to display for this patient.   Past Surgical History:  Procedure Laterality Date  . BRAIN SURGERY  2007  . CERVICAL CONE BIOPSY    . CESAREAN SECTION    . COLONOSCOPY WITH PROPOFOL N/A 10/26/2016   Procedure: COLONOSCOPY WITH PROPOFOL;  Surgeon: Lollie Sails, MD;  Location: [facility] ENDOSCOPY;  Service: Endoscopy;  Laterality: N/A;  . THYROID SURGERY      Prior to Admission medications   Medication Sig Start Date End Date Taking? Authorizing Provider  amoxicillin-clavulanate (AUGMENTIN) 875-125 MG tablet Take 1 tablet by mouth 2 (two) times daily. 06/19/17   [name], Charline Bills, PA-C  brompheniramine-pseudoephedrine-DM 30-2-10 MG/5ML syrup Take 10 mLs by mouth 4 (four) times daily as needed. 06/19/17   [name], Charline Bills, PA-C  cetirizine (ZYRTEC) 10 MG tablet Take 1 tablet (10 mg  total) by mouth daily. 06/19/17   [name], Charline Bills, PA-C  fluticasone (FLONASE) 50 MCG/ACT nasal spray Place 1 spray into both nostrils 2 (two) times daily. 06/19/17   [name], Charline Bills, PA-C  levothyroxine (SYNTHROID, LEVOTHROID) 100 MCG tablet  12/05/14   [provider]  lisinopril-hydrochlorothiazide (PRINZIDE,ZESTORETIC) 10-12.5 MG tablet Take 1 tablet by mouth daily.    [provider]  Sod Picosulfate-Mag Ox-Cit Acd 10-3.5-12 MG-GM-GM PACK Take by mouth.    [provider]    Allergies Naproxen  No family history on file.  Social History Social History   Tobacco Use  . Smoking status: Never Smoker  . Smokeless tobacco: Never Used  Substance Use Topics  . Alcohol use: No  . Drug use: No     Review of Systems  Constitutional: Positive fever/chills Eyes: No visual changes. No discharge ENT: Positive for nasal congestion and sore throat Cardiovascular: no chest pain. Respiratory: Positive cough. No SOB. Gastrointestinal: No abdominal pain.  No nausea, no vomiting.  No diarrhea.  No constipation. Genitourinary: Negative for dysuria. No hematuria Musculoskeletal: Negative for musculoskeletal pain. Skin: Negative for rash, abrasions, lacerations, ecchymosis. Neurological: Negative for headaches, focal weakness or numbness. 10-point ROS otherwise negative.  ____________________________________________   PHYSICAL EXAM:  VITAL SIGNS: ED Triage Vitals  Enc Vitals Group     BP 06/19/17 1629 (!) 143/89     Pulse Rate 06/19/17 1629 94     Resp 06/19/17 1629 16     Temp 06/19/17 1629 99.8 F (37.7 C)     Temp Source 06/19/17 1629 Oral     SpO2  06/19/17 1629 97 %     Weight 06/19/17 1628 124 lb (56.2 kg)     Height 06/19/17 1628 5\' 3"  (1.6 m)     Head Circumference --      Peak Flow --      Pain Score 06/19/17 1628 8     Pain Loc --      Pain Edu? --      Excl. in [place]? --      Constitutional: Alert and oriented. Well appearing and  in no acute distress. Eyes: Conjunctivae are normal. PERRL. EOMI. Head: Atraumatic. ENT:      Ears: EACs and TMs unremarkable bilaterally.      Nose: Moderate clear congestion/rhinnorhea.  Turbinates are erythematous and edematous      Mouth/Throat: Mucous membranes are moist.  Pharynx is nonerythematous not edematous.  Uvula is midline. Neck: No stridor.  Neck is supple full range of motion Hematological/Lymphatic/Immunilogical: No cervical lymphadenopathy. Cardiovascular: Normal rate, regular rhythm. Normal S1 and S2.  Good peripheral circulation. Respiratory: Normal respiratory effort without tachypnea or retractions. Lungs with coarse breath sounds left lower lobe.  No wheezing, rales, rhonchi.Kermit Balo air entry to the bases with no decreased or absent breath sounds. Musculoskeletal: Full range of motion to all extremities. No gross deformities appreciated. Neurologic:  Normal speech and language. No gross focal neurologic deficits are appreciated.  Skin:  Skin is warm, dry and intact. No rash noted. Psychiatric: Mood and affect are normal. Speech and behavior are normal. Patient exhibits appropriate insight and judgement.   ____________________________________________   LABS (all labs ordered are listed, but only abnormal results are displayed)  Labs Reviewed  INFLUENZA PANEL BY PCR (TYPE A & B)   ____________________________________________  EKG   ____________________________________________  RADIOLOGY Diamantina Providence Dane Kopke, personally viewed and evaluated these images (plain radiographs) as part of my medical decision making, as well as reviewing the written report by the radiologist.  I concur with the radiologist finding of no effusion or infiltrate consistent with pneumonia.  Dg Chest 2 View  Result Date: 06/19/2017 CLINICAL DATA:  Cough and congestion. EXAM: CHEST - 2 VIEW COMPARISON:  10/30/2013 FINDINGS: Lungs are hyperexpanded. The lungs are clear without focal  pneumonia, edema, pneumothorax or pleural effusion. The cardiopericardial silhouette is within normal limits for size. The visualized bony structures of the thorax are intact. IMPRESSION: No active cardiopulmonary disease. Electronically Signed   By: Misty Stanley M.D.   On: 06/19/2017 19:42    ____________________________________________    PROCEDURES  Procedure(s) performed:    Procedures    Medications  dexamethasone (DECADRON) injection 10 mg (has no administration in time range)  diphenhydrAMINE (BENADRYL) injection 50 mg (has no administration in time range)  promethazine (PHENERGAN) injection 12.5 mg (has no administration in time range)     ____________________________________________   INITIAL IMPRESSION / ASSESSMENT AND PLAN / ED COURSE  Pertinent labs & imaging results that were available during my care of the patient were reviewed by me and considered in my medical decision making (see chart for details).  Review of the [place] CSRS was performed in accordance of the JAARS prior to dispensing any controlled drugs.     Patient's diagnosis is consistent with viral URI with sinusitis.  Patient presents with 3-day history of nasal congestion, sore throat, cough, fevers and chills, body aches.  Differential included sinusitis, viral URI, influenza, pneumonia, bronchitis.  Chest x-ray reveals no consolidation consistent with pneumonia.  Influenza testing was negative.  Based off the  patient's symptoms, physical exam, suspect viral URI.  Patient is borderline for sinusitis.  At this time, I advised patient to start conservative therapy with medications that I prescribed to include Flonase, Zyrtec, Bromfed cough syrup.  If symptoms of sinus congestion and pressure and headache do not improve within the next 2 days, she is to fill the antibiotic and take same.  No other prescriptions.  Tylenol and Motrin at home as needed.  Patient is advised to drink plenty of fluids.  No indication  for further workup at this time..  Patient will follow primary care as needed patient is given ED precautions to return to the ED for any worsening or new symptoms.     ____________________________________________  FINAL CLINICAL IMPRESSION(S) / ED DIAGNOSES  Final diagnoses:  Viral URI with cough  Acute non-recurrent frontal sinusitis      NEW MEDICATIONS STARTED DURING THIS VISIT:  ED Discharge Orders        Ordered    fluticasone (FLONASE) 50 MCG/ACT nasal spray  2 times daily     06/19/17 2001    cetirizine (ZYRTEC) 10 MG tablet  Daily     06/19/17 2001    brompheniramine-pseudoephedrine-DM 30-2-10 MG/5ML syrup  4 times daily PRN     06/19/17 2001    amoxicillin-clavulanate (AUGMENTIN) 875-125 MG tablet  2 times daily     06/19/17 2002          This chart was dictated using voice recognition software/Dragon. Despite best efforts to proofread, errors can occur which can change the meaning. Any change was purely unintentional.    Brynda Peon 06/19/17 2004    Harvest Dark, MD 06/19/17 [phone]

## 2017-06-19 NOTE — ED Triage Notes (Signed)
Pt to ED via POV c/o cough, congestion, stuffy nose, and body aches since Thursday. Pt states that she has not checked her temperature but she has felt like she was running fever. Pt in NAD at this time.

## 2017-06-22 DIAGNOSIS — E039 Hypothyroidism, unspecified: Secondary | ICD-10-CM | POA: Diagnosis not present

## 2017-06-22 DIAGNOSIS — J019 Acute sinusitis, unspecified: Secondary | ICD-10-CM | POA: Diagnosis not present

## 2017-06-22 DIAGNOSIS — E038 Other specified hypothyroidism: Secondary | ICD-10-CM | POA: Diagnosis not present

## 2017-06-22 DIAGNOSIS — J309 Allergic rhinitis, unspecified: Secondary | ICD-10-CM | POA: Diagnosis not present

## 2017-07-20 DIAGNOSIS — M76892 Other specified enthesopathies of left lower limb, excluding foot: Secondary | ICD-10-CM | POA: Diagnosis not present

## 2017-07-20 DIAGNOSIS — I10 Essential (primary) hypertension: Secondary | ICD-10-CM | POA: Diagnosis not present

## 2017-07-20 DIAGNOSIS — R29898 Other symptoms and signs involving the musculoskeletal system: Secondary | ICD-10-CM | POA: Diagnosis not present

## 2017-07-20 DIAGNOSIS — E041 Nontoxic single thyroid nodule: Secondary | ICD-10-CM | POA: Diagnosis not present

## 2017-07-21 DIAGNOSIS — R5381 Other malaise: Secondary | ICD-10-CM | POA: Diagnosis not present

## 2017-07-21 DIAGNOSIS — D518 Other vitamin B12 deficiency anemias: Secondary | ICD-10-CM | POA: Diagnosis not present

## 2017-10-27 ENCOUNTER — Other Ambulatory Visit: Payer: Self-pay

## 2018-02-17 ENCOUNTER — Other Ambulatory Visit: Payer: Self-pay

## 2018-03-08 DIAGNOSIS — M76892 Other specified enthesopathies of left lower limb, excluding foot: Secondary | ICD-10-CM | POA: Diagnosis not present

## 2018-03-08 DIAGNOSIS — R29898 Other symptoms and signs involving the musculoskeletal system: Secondary | ICD-10-CM | POA: Diagnosis not present

## 2018-03-08 DIAGNOSIS — Z0000 Encounter for general adult medical examination without abnormal findings: Secondary | ICD-10-CM | POA: Diagnosis not present

## 2018-03-08 DIAGNOSIS — I10 Essential (primary) hypertension: Secondary | ICD-10-CM | POA: Diagnosis not present

## 2018-03-08 DIAGNOSIS — E041 Nontoxic single thyroid nodule: Secondary | ICD-10-CM | POA: Diagnosis not present

## 2018-03-19 ENCOUNTER — Emergency Department
Admission: EM | Admit: 2018-03-19 | Discharge: 2018-03-19 | Disposition: A | Discharge status: Home / Self Care | Payer: Medicare Other | Attending: Emergency Medicine | Admitting: Emergency Medicine

## 2018-03-19 ENCOUNTER — Emergency Department: Payer: Medicare Other

## 2018-03-19 ENCOUNTER — Encounter: Payer: Self-pay | Admitting: Emergency Medicine

## 2018-03-19 DIAGNOSIS — R079 Chest pain, unspecified: Secondary | ICD-10-CM | POA: Diagnosis not present

## 2018-03-19 DIAGNOSIS — Z79899 Other long term (current) drug therapy: Secondary | ICD-10-CM | POA: Diagnosis not present

## 2018-03-19 DIAGNOSIS — F419 Anxiety disorder, unspecified: Secondary | ICD-10-CM | POA: Insufficient documentation

## 2018-03-19 DIAGNOSIS — R6883 Chills (without fever): Secondary | ICD-10-CM | POA: Diagnosis present

## 2018-03-19 DIAGNOSIS — I10 Essential (primary) hypertension: Secondary | ICD-10-CM | POA: Diagnosis not present

## 2018-03-19 DIAGNOSIS — Z87891 Personal history of nicotine dependence: Secondary | ICD-10-CM | POA: Insufficient documentation

## 2018-03-19 DIAGNOSIS — R05 Cough: Secondary | ICD-10-CM | POA: Diagnosis not present

## 2018-03-19 LAB — COMPREHENSIVE METABOLIC PANEL
ALK PHOS: 60 U/L (ref 38–126)
ALT: 17 U/L (ref 0–44)
ANION GAP: 6 (ref 5–15)
AST: 21 U/L (ref 15–41)
Albumin: 4.1 g/dL (ref 3.5–5.0)
BUN: 18 mg/dL (ref 8–23)
CALCIUM: 9 mg/dL (ref 8.9–10.3)
CO2: 28 mmol/L (ref 22–32)
Chloride: 107 mmol/L (ref 98–111)
Creatinine, Ser: 0.9 mg/dL (ref 0.44–1.00)
GFR calc Af Amer: >60 mL/min (ref >60)
GFR calc non Af Amer: >60 mL/min (ref >60)
Glucose, Bld: 95 mg/dL (ref 70–99)
Potassium: 3.3 mmol/L — ABNORMAL LOW (ref 3.5–5.1)
Sodium: 141 mmol/L (ref 135–145)
TOTAL PROTEIN: 7.3 g/dL (ref 6.5–8.1)
Total Bilirubin: 0.9 mg/dL (ref 0.3–1.2)

## 2018-03-19 LAB — CBC
HCT: 41.8 % (ref 36.0–46.0)
HEMOGLOBIN: 13.8 g/dL (ref 12.0–15.0)
MCH: 29.1 pg (ref 26.0–34.0)
MCHC: 33 g/dL (ref 30.0–36.0)
MCV: 88.2 fL (ref 80.0–100.0)
NRBC: 0 % (ref 0.0–0.2)
Platelets: 196 10*3/uL (ref 150–400)
RBC: 4.74 MIL/uL (ref 3.87–5.11)
RDW: 11.9 % (ref 11.5–15.5)
WBC: 4.7 10*3/uL (ref 4.0–10.5)

## 2018-03-19 LAB — TROPONIN I: Troponin I: <0.03 ng/mL (ref <0.03)

## 2018-03-19 MED ORDER — HYDROXYZINE HCL 25 MG PO TABS: 25.0000 mg | ORAL_TABLET | Freq: Three times a day (TID) | ORAL | 0 refills | Status: DC | PRN

## 2018-03-19 NOTE — ED Provider Notes (Signed)
[facility] Emergency Department Provider Note  Time seen: 7:24 AM  I have reviewed the triage vital signs and the nursing notes.   HISTORY  Chief Complaint Chest Pain    HPI Suzanne Ritter is a 67 y.o. female with a past medical history of hypertension presents to the emergency department with complaints of not feeling right.  According to the patient since last night she has been experiencing intermittent chills, feeling like her blood pressure is high, states it feels like her body is having a panic attack.  Patient states she does not feel overly anxious, but states that her body feels nervous.  Denies any chest pain or trouble breathing.  No fever cough congestion dysuria or abdominal pain.  Patient states she feels much better, states she is worried because her blood pressures been running really high, states she did not know if she was going to have a heart attack or a stroke.  Patient states she was prescribed lisinopril by her doctor for blood pressure but she does not take it because she did not like the way it made her feel.  Saw her doctor recently who switched her to losartan.  Patient states she filled the prescription 3 days ago but has not yet started the medication.   Past Medical History:  Diagnosis Date  . Hypertension   . Thyroid disease     There are no active problems to display for this patient.   Past Surgical History:  Procedure Laterality Date  . BRAIN SURGERY  2007  . CERVICAL CONE BIOPSY    . CESAREAN SECTION    . COLONOSCOPY WITH PROPOFOL N/A 10/26/2016   Procedure: COLONOSCOPY WITH PROPOFOL;  Surgeon: Lollie Sails, MD;  Location: [facility] ENDOSCOPY;  Service: Endoscopy;  Laterality: N/A;  . THYROID SURGERY      Prior to Admission medications   Medication Sig Start Date End Date Taking? Authorizing Provider  amoxicillin-clavulanate (AUGMENTIN) 875-125 MG tablet Take 1 tablet by mouth 2 (two) times daily. 06/19/17    Cuthriell, Charline Bills, PA-C  brompheniramine-pseudoephedrine-DM 30-2-10 MG/5ML syrup Take 10 mLs by mouth 4 (four) times daily as needed. 06/19/17   Cuthriell, Charline Bills, PA-C  cetirizine (ZYRTEC) 10 MG tablet Take 1 tablet (10 mg total) by mouth daily. 06/19/17   Cuthriell, Charline Bills, PA-C  fluticasone (FLONASE) 50 MCG/ACT nasal spray Place 1 spray into both nostrils 2 (two) times daily. 06/19/17   Cuthriell, Charline Bills, PA-C  levothyroxine (SYNTHROID, LEVOTHROID) 100 MCG tablet  12/05/14   [provider]  lisinopril-hydrochlorothiazide (PRINZIDE,ZESTORETIC) 10-12.5 MG tablet Take 1 tablet by mouth daily.    [provider]  Sod Picosulfate-Mag Ox-Cit Acd 10-3.5-12 MG-GM-GM PACK Take by mouth.    [provider]    Allergies  Allergen Reactions  . Naproxen Other (See Comments)    Other Reaction: GI Upset    No family history on file.  Social History Social History   Tobacco Use  . Smoking status: Former Research scientist (life sciences)  . Smokeless tobacco: Never Used  Substance Use Topics  . Alcohol use: No  . Drug use: No    Review of Systems Constitutional: Negative for fever. Cardiovascular: Negative for chest pain. Respiratory: Negative for shortness of breath. Gastrointestinal: Negative for abdominal pain, vomiting and diarrhea. Genitourinary: Negative for urinary compaints Musculoskeletal: Negative for musculoskeletal complaints Skin: Negative for skin complaints  Neurological: Negative for headache All other ROS negative  ____________________________________________   PHYSICAL EXAM:  VITAL SIGNS: ED Triage  Vitals  Enc Vitals Group     BP 03/19/18 0654 (!) 158/95     Pulse Rate 03/19/18 0654 66     Resp 03/19/18 0654 18     Temp 03/19/18 0654 98.4 F (36.9 C)     Temp Source 03/19/18 0654 Oral     SpO2 03/19/18 0654 98 %     Weight 03/19/18 0648 115 lb (52.2 kg)     Height 03/19/18 0648 5\' 3"  (1.6 m)     Head Circumference --      Peak Flow --       Pain Score 03/19/18 0648 0     Pain Loc --      Pain Edu? --      Excl. in [place]? --    Constitutional: Alert and oriented. Well appearing and in no distress. Eyes: Normal exam ENT   Head: Normocephalic and atraumatic.   Mouth/Throat: Mucous membranes are moist. Cardiovascular: Normal rate, regular rhythm. No murmur Respiratory: Normal respiratory effort without tachypnea nor retractions. Breath sounds are clear  Gastrointestinal: Soft and nontender. No distention.  Musculoskeletal: Nontender with normal range of motion in all extremities. No lower extremity tenderness or edema. Neurologic:  Normal speech and language. No gross focal neurologic deficits  Skin:  Skin is warm, dry and intact.  Psychiatric: Mood and affect are normal.   ____________________________________________    EKG  EKG viewed and interpreted by myself shows a normal sinus rhythm at 64 bpm with a narrow QRS, normal axis, normal intervals, no ST changes.  ____________________________________________    RADIOLOGY  COPD otherwise negative  ____________________________________________   INITIAL IMPRESSION / ASSESSMENT AND PLAN / ED COURSE  Pertinent labs & imaging results that were available during my care of the patient were reviewed by me and considered in my medical decision making (see chart for details).  Patient presents to the emergency department with symptoms of "a panic attack" in her body per patient.  States she felt chills she felt like her blood pressure is up.  Patient denies any chest pain or any pain whatsoever.  Patient states she feels normal currently.  Patient was prescribed a new blood pressure medication, but states she has not yet filled it.  Blood pressure is 158/95 upon arrival.  I had a discussion with the patient about filling and starting her new blood pressure medication and she states she will do so today, she is more worried about the side effect profile of taking any  medication.  Overall the patient appears well reassuring EKG, chest x-ray labs are pending.  Patient's labs are within normal limits including normal chemistry, CBC negative troponin.  Chest x-ray is nonrevealing.  EKG is reassuring.  We will discharge home with a short course of hydroxyzine have the patient follow-up with her primary care doctor.  Patient will start her losartan.  Patient agreeable to plan of care.  ____________________________________________   FINAL CLINICAL IMPRESSION(S) / ED DIAGNOSES  Anxiety Hypertension    Harvest Dark, MD 03/19/18 [phone]

## 2018-03-19 NOTE — ED Notes (Signed)
.   Pt is resting, Respirations even and unlabored, NAD. Stretcher lowest postion and locked. Call bell within reach. Denies any needs at this time RN will continue to monitor.    

## 2018-03-19 NOTE — ED Triage Notes (Signed)
Patient states that last night she felt like her heart was racing. States that it improved and was able to sleep. Patient states that this morning when she woke up that she felt like her heart was racing again. Patient denies shortness of breath but states that she has some nausea.

## 2018-03-19 NOTE — ED Notes (Signed)
Patient transported to X-ray 

## 2018-03-19 NOTE — ED Notes (Signed)

## 2018-03-19 NOTE — ED Notes (Signed)
.   Pt is resting, Respirations even and unlabored, NAD. Stretcher lowest postion and locked. Call bell within reach. Denies any needs at this time RN will continue to monitor.  Awaiting Lab Results and xray results at this time.

## 2018-05-24 ENCOUNTER — Other Ambulatory Visit: Payer: Self-pay

## 2018-05-24 ENCOUNTER — Emergency Department: Payer: Medicare Other

## 2018-05-24 ENCOUNTER — Encounter: Payer: Self-pay | Admitting: Emergency Medicine

## 2018-05-24 ENCOUNTER — Emergency Department
Admission: EM | Admit: 2018-05-24 | Discharge: 2018-05-24 | Disposition: A | Discharge status: Home / Self Care | Payer: Medicare Other | Attending: Emergency Medicine | Admitting: Emergency Medicine

## 2018-05-24 DIAGNOSIS — E079 Disorder of thyroid, unspecified: Secondary | ICD-10-CM | POA: Diagnosis not present

## 2018-05-24 DIAGNOSIS — Z79899 Other long term (current) drug therapy: Secondary | ICD-10-CM | POA: Insufficient documentation

## 2018-05-24 DIAGNOSIS — I10 Essential (primary) hypertension: Secondary | ICD-10-CM | POA: Diagnosis not present

## 2018-05-24 DIAGNOSIS — R9431 Abnormal electrocardiogram [ECG] [EKG]: Secondary | ICD-10-CM | POA: Diagnosis not present

## 2018-05-24 DIAGNOSIS — Z87891 Personal history of nicotine dependence: Secondary | ICD-10-CM | POA: Diagnosis not present

## 2018-05-24 DIAGNOSIS — R519 Headache, unspecified: Secondary | ICD-10-CM

## 2018-05-24 DIAGNOSIS — R51 Headache: Secondary | ICD-10-CM | POA: Diagnosis not present

## 2018-05-24 DIAGNOSIS — G8929 Other chronic pain: Secondary | ICD-10-CM | POA: Diagnosis not present

## 2018-05-24 DIAGNOSIS — R1110 Vomiting, unspecified: Secondary | ICD-10-CM | POA: Diagnosis present

## 2018-05-24 DIAGNOSIS — R112 Nausea with vomiting, unspecified: Secondary | ICD-10-CM | POA: Insufficient documentation

## 2018-05-24 LAB — COMPREHENSIVE METABOLIC PANEL
ALBUMIN: 4.6 g/dL (ref 3.5–5.0)
ALT: 27 U/L (ref 0–44)
ANION GAP: 15 (ref 5–15)
AST: 35 U/L (ref 15–41)
Alkaline Phosphatase: 63 U/L (ref 38–126)
BUN: 11 mg/dL (ref 8–23)
CHLORIDE: 100 mmol/L (ref 98–111)
CO2: 21 mmol/L — AB (ref 22–32)
Calcium: 9.2 mg/dL (ref 8.9–10.3)
Creatinine, Ser: 0.7 mg/dL (ref 0.44–1.00)
GFR calc non Af Amer: >60 mL/min (ref >60)
Glucose, Bld: 156 mg/dL — ABNORMAL HIGH (ref 70–99)
Potassium: 3.6 mmol/L (ref 3.5–5.1)
SODIUM: 136 mmol/L (ref 135–145)
Total Bilirubin: 0.9 mg/dL (ref 0.3–1.2)
Total Protein: 7.9 g/dL (ref 6.5–8.1)

## 2018-05-24 LAB — INFLUENZA PANEL BY PCR (TYPE A & B)
Influenza A By PCR: NEGATIVE
Influenza B By PCR: NEGATIVE

## 2018-05-24 LAB — CBC
HEMATOCRIT: 43 % (ref 36.0–46.0)
Hemoglobin: 14.4 g/dL (ref 12.0–15.0)
MCH: 29.3 pg (ref 26.0–34.0)
MCHC: 33.5 g/dL (ref 30.0–36.0)
MCV: 87.6 fL (ref 80.0–100.0)
NRBC: 0 % (ref 0.0–0.2)
Platelets: 194 10*3/uL (ref 150–400)
RBC: 4.91 MIL/uL (ref 3.87–5.11)
RDW: 12.1 % (ref 11.5–15.5)
WBC: 6.3 10*3/uL (ref 4.0–10.5)

## 2018-05-24 LAB — LIPASE, BLOOD: LIPASE: 26 U/L (ref 11–51)

## 2018-05-24 LAB — TROPONIN I: Troponin I: <0.03 ng/mL (ref <0.03)

## 2018-05-24 MED ORDER — MORPHINE SULFATE (PF) 4 MG/ML IV SOLN
4.0000 mg | Freq: Once | INTRAVENOUS | Status: AC
  Administered 2018-05-24: 4 mg via INTRAVENOUS
  Filled 2018-05-24: qty 1

## 2018-05-24 MED ORDER — ONDANSETRON HCL 4 MG PO TABS: 4.0000 mg | ORAL_TABLET | Freq: Three times a day (TID) | ORAL | 0 refills | Status: DC | PRN

## 2018-05-24 MED ORDER — SODIUM CHLORIDE 0.9 % IV BOLUS
1000.0000 mL | Freq: Once | INTRAVENOUS | Status: AC
  Administered 2018-05-24: 1000 mL via INTRAVENOUS

## 2018-05-24 MED ORDER — ONDANSETRON 4 MG PO TBDP
ORAL_TABLET | ORAL | Status: AC
  Filled 2018-05-24: qty 1

## 2018-05-24 MED ORDER — ONDANSETRON HCL 4 MG/2ML IJ SOLN
4.0000 mg | Freq: Once | INTRAMUSCULAR | Status: AC
  Administered 2018-05-24: 4 mg via INTRAVENOUS
  Filled 2018-05-24: qty 2

## 2018-05-24 MED ORDER — ONDANSETRON 4 MG PO TBDP
4.0000 mg | ORAL_TABLET | Freq: Once | ORAL | Status: AC | PRN
  Administered 2018-05-24: 4 mg via ORAL

## 2018-05-24 MED ORDER — SODIUM CHLORIDE 0.9% FLUSH: 3.0000 mL | Freq: Once | INTRAVENOUS | Status: DC

## 2018-05-24 NOTE — ED Notes (Signed)
MD at bedside. 

## 2018-05-24 NOTE — ED Notes (Signed)
Patient discharged to home per MD order. Patient in stable condition, and deemed medically cleared by ED provider for discharge. Discharge instructions reviewed with patient/family using "Teach Back"; verbalized understanding of medication education and administration, and information about follow-up care. Denies further concerns. ° °

## 2018-05-24 NOTE — ED Provider Notes (Addendum)
[facility] Emergency Department Provider Note  ____________________________________________   I have reviewed the triage vital signs and the nursing notes. Where available I have reviewed prior notes and, if possible and indicated, outside hospital notes.    HISTORY  Chief Complaint Emesis    HPI Suzanne Ritter is a 68 y.o. female presents today complaining of a vomiting disease.  Dates she is around other people with vomiting illnesses, she started vomiting this morning.  She also has a headache.  Patient has a chronic headaches.  She was unable to take her blood pressure early in the day and her blood pressure has gone up and she knows when her blood pressure goes up she is going to get a headache and this is the same headache she always has.  She denies any stiff neck change in vision or hearing or focal neurologic deficit.  Not the worst headache of life, gradual in onset.  Patient states that she has had no fever, no stiff neck.  She states that she is not blowing her blood.  She has had 1 stool today which seemed nearly normal, perhaps a little loose.  Not had any chest pain or shortness of breath. She denies any abdominal pain at this time.  She still feels nauseated but feels somewhat better after the antiemetic she was given.    Past Medical History:  Diagnosis Date  . Hypertension   . Thyroid disease     There are no active problems to display for this patient.   Past Surgical History:  Procedure Laterality Date  . BRAIN SURGERY  2007  . CERVICAL CONE BIOPSY    . CESAREAN SECTION    . COLONOSCOPY WITH PROPOFOL N/A 10/26/2016   Procedure: COLONOSCOPY WITH PROPOFOL;  Surgeon: Lollie Sails, MD;  Location: [facility] ENDOSCOPY;  Service: Endoscopy;  Laterality: N/A;  . THYROID SURGERY      Prior to Admission medications   Medication Sig Start Date End Date Taking? Authorizing Provider  amoxicillin-clavulanate (AUGMENTIN) 875-125 MG  tablet Take 1 tablet by mouth 2 (two) times daily. 06/19/17   Cuthriell, Charline Bills, PA-C  brompheniramine-pseudoephedrine-DM 30-2-10 MG/5ML syrup Take 10 mLs by mouth 4 (four) times daily as needed. 06/19/17   Cuthriell, Charline Bills, PA-C  cetirizine (ZYRTEC) 10 MG tablet Take 1 tablet (10 mg total) by mouth daily. 06/19/17   Cuthriell, Charline Bills, PA-C  fluticasone (FLONASE) 50 MCG/ACT nasal spray Place 1 spray into both nostrils 2 (two) times daily. 06/19/17   Cuthriell, Charline Bills, PA-C  hydrOXYzine (ATARAX/VISTARIL) 25 MG tablet Take 1 tablet (25 mg total) by mouth 3 (three) times daily as needed for anxiety. 03/19/18   Harvest Dark, MD  levothyroxine (SYNTHROID, LEVOTHROID) 100 MCG tablet  12/05/14   [provider]  lisinopril-hydrochlorothiazide (PRINZIDE,ZESTORETIC) 10-12.5 MG tablet Take 1 tablet by mouth daily.    [provider]  Sod Picosulfate-Mag Ox-Cit Acd 10-3.5-12 MG-GM-GM PACK Take by mouth.    [provider]    Allergies Naproxen  No family history on file.  Social History Social History   Tobacco Use  . Smoking status: Former Research scientist (life sciences)  . Smokeless tobacco: Never Used  Substance Use Topics  . Alcohol use: No  . Drug use: No    Review of Systems Constitutional: No fever/chills Eyes: No visual changes. ENT: No sore throat. No stiff neck no neck pain Cardiovascular: Denies chest pain. Respiratory: Denies shortness of breath. Gastrointestinal:   + vomiting.  No diarrhea.  No constipation. Genitourinary: Negative for dysuria. Musculoskeletal: Negative lower extremity swelling Skin: Negative for rash. Neurological: Negative for atypical headaches, focal weakness or numbness.   ____________________________________________   PHYSICAL EXAM:  VITAL SIGNS: ED Triage Vitals  Enc Vitals Group     BP 05/24/18 1840 (!) 170/97     Pulse Rate 05/24/18 1840 94     Resp 05/24/18 1840 18     Temp 05/24/18 1840 98.4 F (36.9 C)     Temp  Source 05/24/18 1840 Oral     SpO2 05/24/18 1840 97 %     Weight 05/24/18 1840 115 lb (52.2 kg)     Height 05/24/18 1840 5\' 3"  (1.6 m)     Head Circumference --      Peak Flow --      Pain Score 05/24/18 1841 0     Pain Loc --      Pain Edu? --      Excl. in [place]? --     Constitutional: Alert and oriented. Well appearing and in no acute distress.  She looks as if she does not feel well but she certainly does not look toxic conversant, in no acute distress medically Eyes: Conjunctivae are normal no funduscopic evidence of increased intracranial pressure noted on limited nondilated exam Head: Atraumatic there is no tenderness palpation around the area of the TMs.  Or the temples. HEENT: No congestion/rhinnorhea. Mucous membranes are dry..  Oropharynx non-erythematous Neck:   Nontender with no meningismus, no masses, no stridor Cardiovascular: Normal rate, regular rhythm. Grossly normal heart sounds.  Good peripheral circulation. Respiratory: Normal respiratory effort.  No retractions. Lungs CTAB. Abdominal: Soft and nontender. No distention. No guarding no rebound Back:  There is no focal tenderness or step off.  there is no midline tenderness there are no lesions noted. there is no CVA tenderness Musculoskeletal: No lower extremity tenderness, no upper extremity tenderness. No joint effusions, no DVT signs strong distal pulses no edema Neurologic:  Normal speech and language. No gross focal neurologic deficits are appreciated.  Skin:  Skin is warm, dry and intact. No rash noted. Psychiatric: Mood and affect are normal. Speech and behavior are normal.  ____________________________________________   LABS (all labs ordered are listed, but only abnormal results are displayed)  Labs Reviewed  COMPREHENSIVE METABOLIC PANEL - Abnormal; Notable for the following components:      Result Value   CO2 21 (*)    Glucose, Bld 156 (*)    All other components within normal limits  LIPASE, BLOOD   CBC  URINALYSIS, COMPLETE (UACMP) WITH MICROSCOPIC  INFLUENZA PANEL BY PCR (TYPE A & B)    Pertinent labs  results that were available during my care of the patient were reviewed by me and considered in my medical decision making (see chart for details). ____________________________________________  EKG  I personally interpreted any EKGs ordered by me or triage Sinus rhythm rate 99 bpm no acute ST elevation or depression normal axis unremarkable EKG ____________________________________________  RADIOLOGY  Pertinent labs & imaging results that were available during my care of the patient were reviewed by me and considered in my medical decision making (see chart for details). If possible, patient and/or family made aware of any abnormal findings.  No results found. ____________________________________________    PROCEDURES  Procedure(s) performed: None  Procedures  Critical Care performed: None  ____________________________________________   INITIAL IMPRESSION / ASSESSMENT AND PLAN / ED COURSE  Pertinent labs & imaging results that were available during  my care of the patient were reviewed by me and considered in my medical decision making (see chart for details).  Here with vomiting, no hematemesis, no melena no bright red blood per rectum, she has elevated blood pressure that is not atypical for her on visits to the emergency room, and she did not take her blood pressure medication until later in the day and she thinks he may have thrown them out.  She does have a headache which is again a typical headache for her, low suspicion for bleed or meningitis but given her history we will obtain CT.  I do not think that lumbar puncture is likely indicated.  We will treat her symptoms with fluids, vomiting in a patient her age with hypertension could be a sign of cardiac disease will obtain an EKG and troponin but I have low suspicion I do think serial enzymes will be indicated.  It  is my hope that we can continue to get her feeling better, she is neurologically intact at this time with an NIH stroke scale of 0.  ----------------------------------------- 11:17 PM on 05/24/2018 -----------------------------------------  Patient I had a long discussion about her symptoms, her headache is completely gone her blood pressure is corrected now that she is less anxious and has had some fluids, her abdomen on serial exams is benign.  There is no evidence of surgical pathology and I do not think imaging is indicated.  Patient has a ride home I have advised her not to drive.  She remains neurologically intact with an NIH stroke scale of 0.  We talked about lumbar puncture but patient declines which I do not think is unreasonable.  Risk-benefit and alternatives of further care and work-up in the emergency room discussed with patient.  She I think has a capacity to understand all of this and is eager to go home.  Given that she feels under percent better, has no ongoing signs or symptoms of disease or pathology, her work-up here is unremarkable and she feels under percent better with corrected blood pressure we will discharge her with close outpatient follow-up and return precautions.  She has a ride home.  Considering the patient's symptoms, medical history, and physical examination today, I have low suspicion for cholecystitis or biliary pathology, pancreatitis, perforation or bowel obstruction, hernia, intra-abdominal abscess, AAA or dissection, volvulus or intussusception, mesenteric ischemia, ischemic gut, pyelonephritis or appendicitis.  Pt remains at neurologic baseline. At this time, there is nothing to suggest or support the diagnosis of subarachnoid hemorrhage, aneurysmal event, meningitis, tumor or mass, cavernous thrombosis, encephalitis, ischemic stroke, pseudotumor cerebri, glaucoma, temporal arteritis, or any other acute intracrania/neurological process. Extensive return precautions  including but not limited to any new or worrisome symptoms such as worsening of, or change in, headache, any neurological symptoms, fever etc.. Natural disease course discussed with patient. The need for follow-up and all of my customary return precautions have been discussed as well.    ____________________________________________   FINAL CLINICAL IMPRESSION(S) / ED DIAGNOSES  Final diagnoses:  None      This chart was dictated using voice recognition software.  Despite best efforts to proofread,  errors can occur which can change meaning.      Schuyler Amor, MD 05/24/18 2226    Schuyler Amor, MD 05/24/18 4818    Schuyler Amor, MD 05/24/18 2320    Schuyler Amor, MD 05/24/18 2321

## 2018-05-24 NOTE — ED Triage Notes (Signed)
Pt from [facility] with c/o nausea and vomiting all day with headache. Pt appears to be fatigue . VSS

## 2018-05-24 NOTE — ED Notes (Signed)
Pt taken to CT via stretcher, husband at bedside.

## 2018-05-24 NOTE — Discharge Instructions (Signed)
We are glad that you are feeling better.  Please do not drive tonight.  Return to the emergency room for any new or worrisome symptoms including fever, vomiting, abdominal pain, headache numbness, weakness, difficulty talking or walking, or any other concerns.  Follow closely with your primary care doctor tomorrow and as we have seen you a few times for headaches in the past we do recommend that you follow closely with your neurologist.

## 2018-07-29 IMAGING — CR DG CHEST 2V
1 series · 2 of 2 positions shown · non-contrast
Comparison: 10/30/2013

CLINICAL DATA: Cough and congestion.

EXAM:
CHEST - 2 VIEW

[Series 1: dg chest 2 view · 0.14mm/px · 2 of 2 slices shown]
[im 1/2]
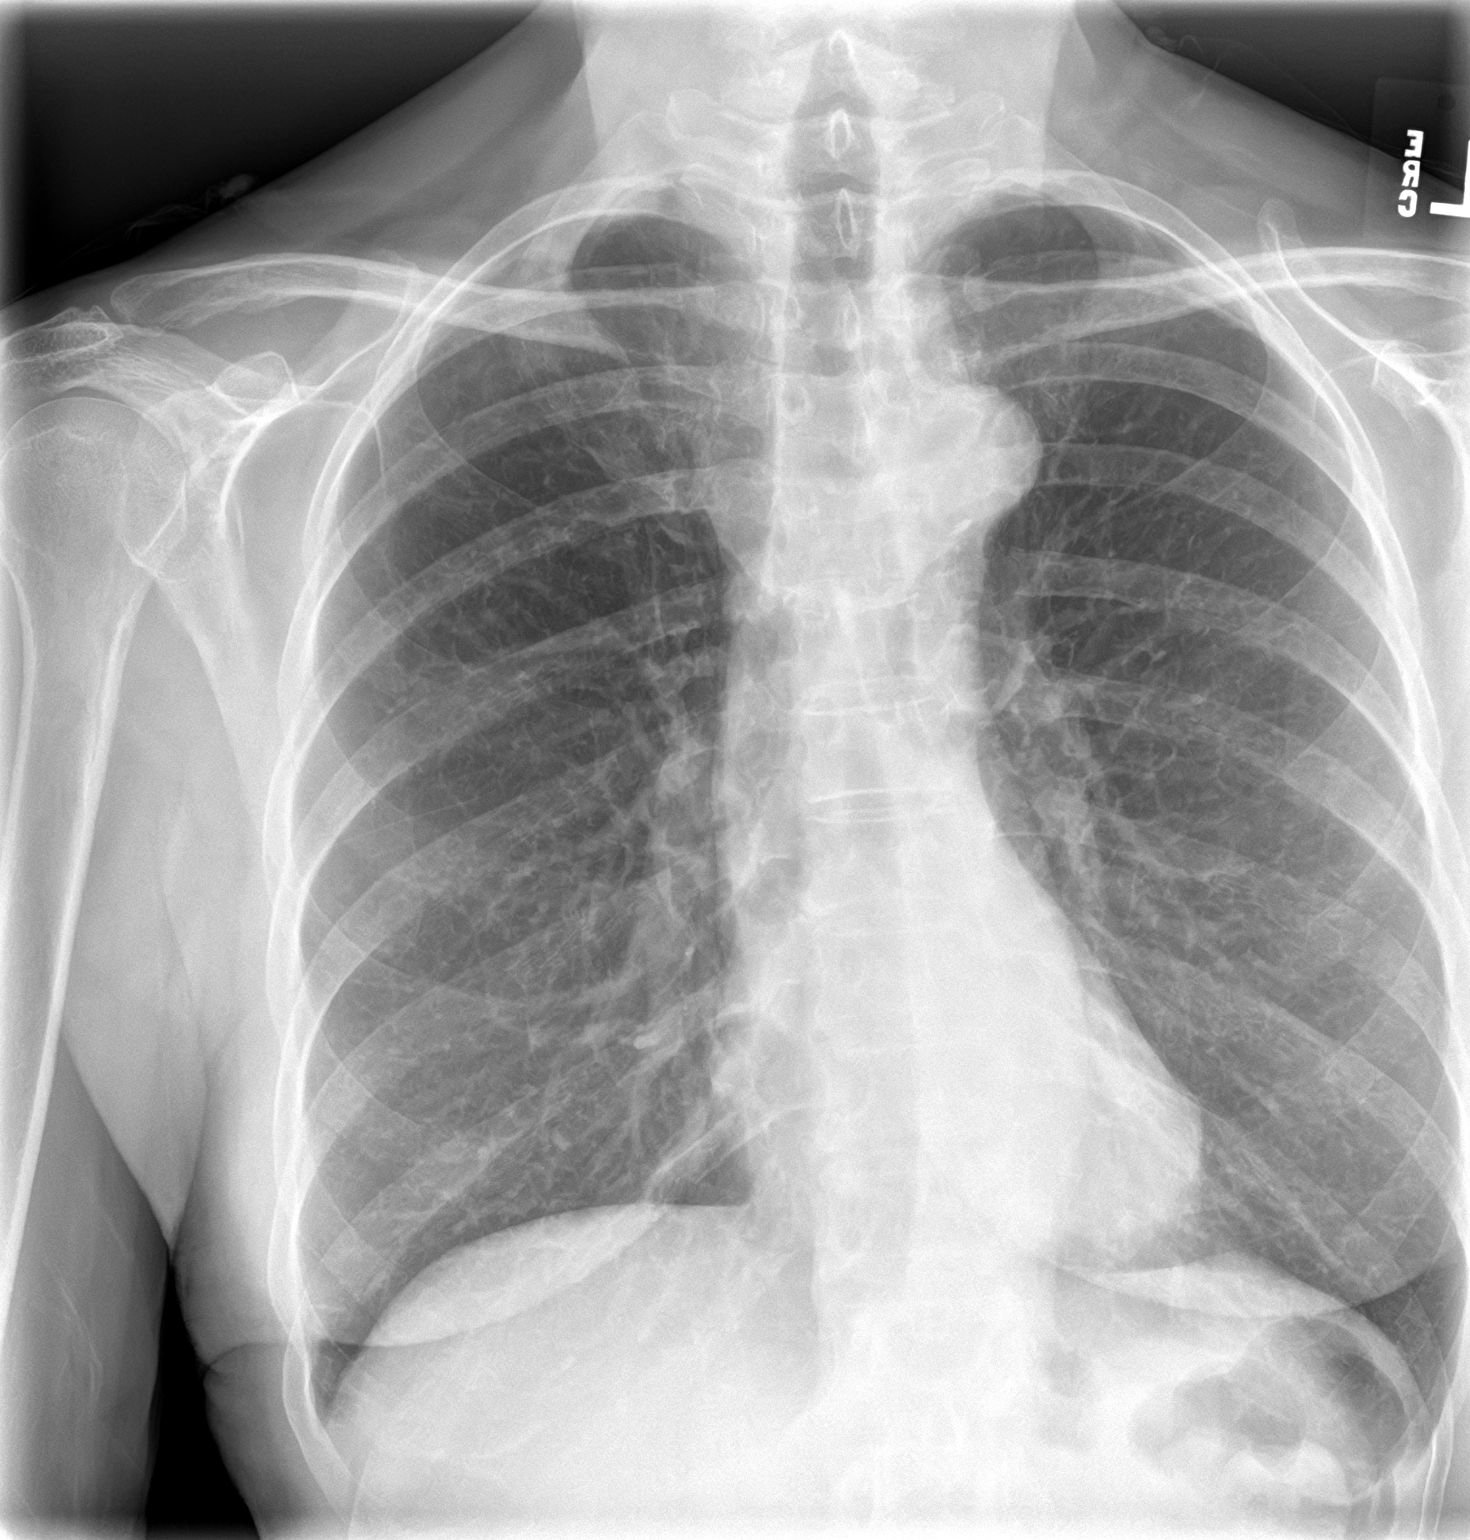
[im 2/2]
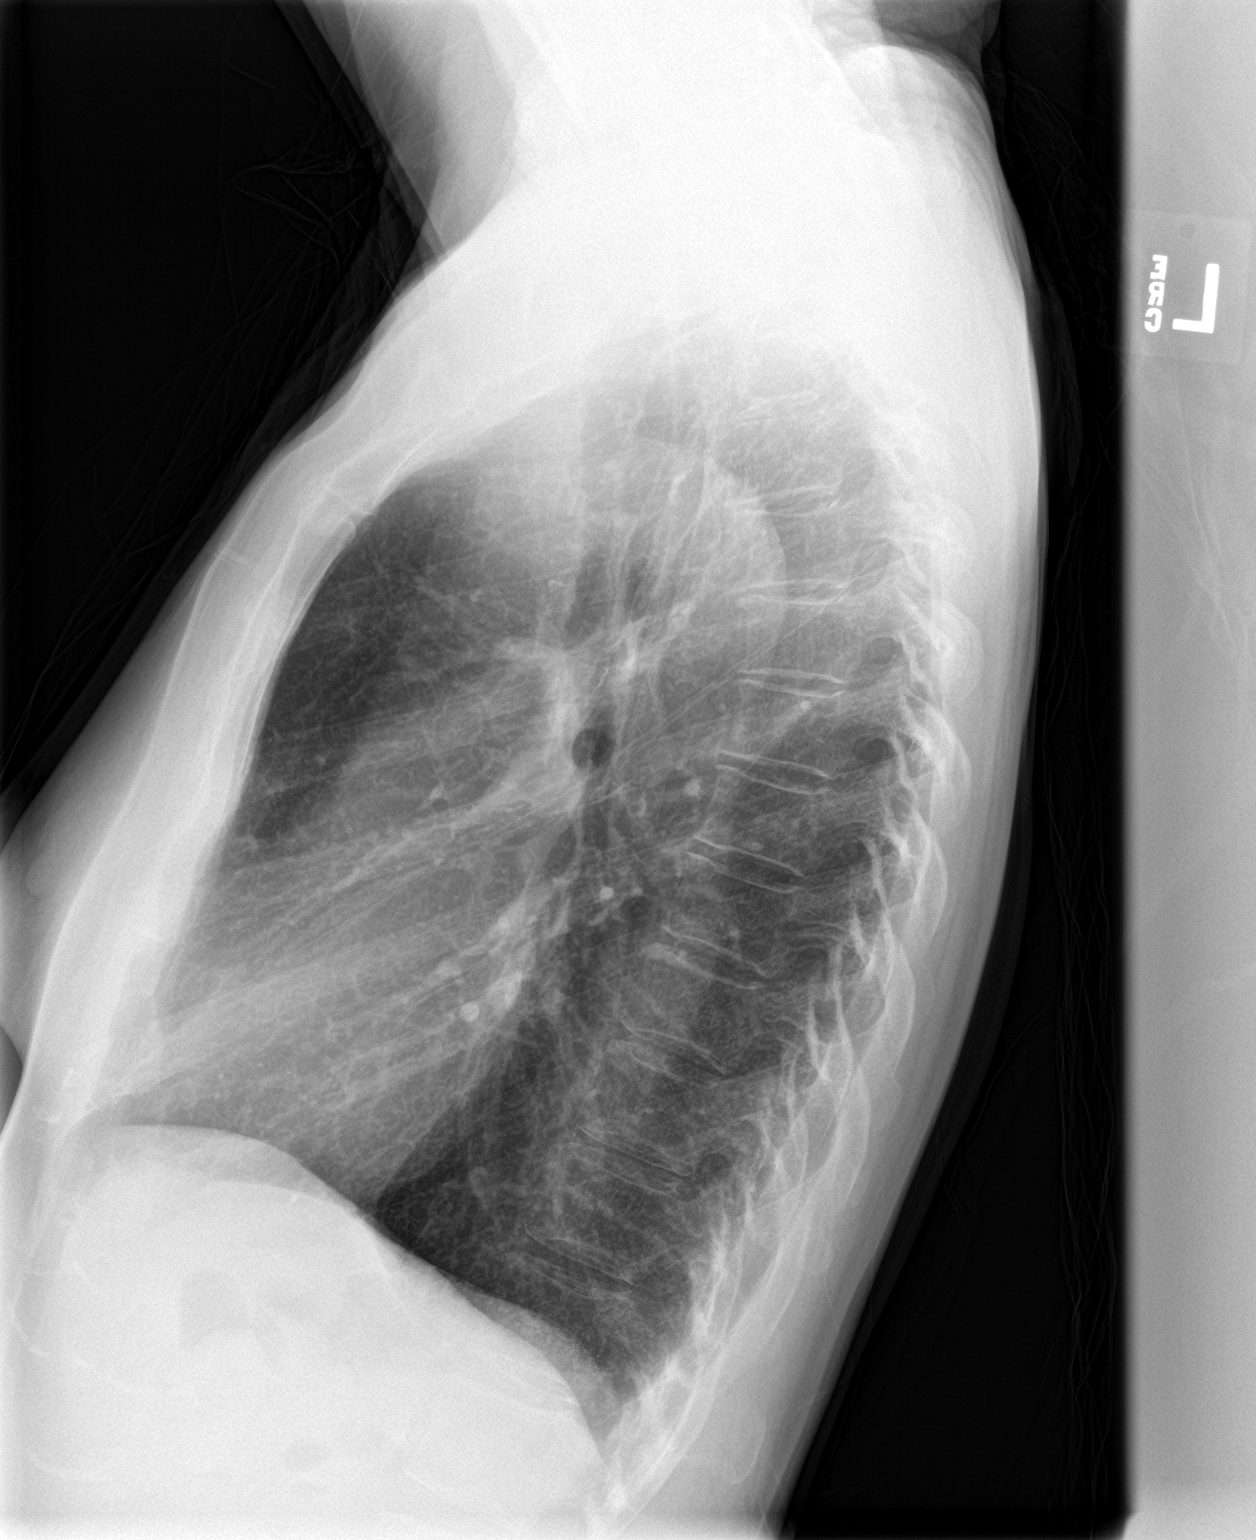

[2 of 2 positions shown; findings below may reference images not displayed]

FINDINGS: Lungs are hyperexpanded. The lungs are clear without focal
pneumonia, edema, pneumothorax or pleural effusion. The
cardiopericardial silhouette is within normal limits for size. The
visualized bony structures of the thorax are intact.
IMPRESSION: No active cardiopulmonary disease.

## 2018-10-11 DIAGNOSIS — M792 Neuralgia and neuritis, unspecified: Secondary | ICD-10-CM | POA: Diagnosis not present

## 2018-10-11 DIAGNOSIS — I119 Hypertensive heart disease without heart failure: Secondary | ICD-10-CM | POA: Diagnosis not present

## 2018-10-11 DIAGNOSIS — M25562 Pain in left knee: Secondary | ICD-10-CM | POA: Diagnosis not present

## 2018-10-11 DIAGNOSIS — E041 Nontoxic single thyroid nodule: Secondary | ICD-10-CM | POA: Diagnosis not present

## 2018-11-11 DIAGNOSIS — D518 Other vitamin B12 deficiency anemias: Secondary | ICD-10-CM | POA: Diagnosis not present

## 2018-11-11 DIAGNOSIS — E7849 Other hyperlipidemia: Secondary | ICD-10-CM | POA: Diagnosis not present

## 2018-11-11 DIAGNOSIS — R5381 Other malaise: Secondary | ICD-10-CM | POA: Diagnosis not present

## 2018-11-11 DIAGNOSIS — I10 Essential (primary) hypertension: Secondary | ICD-10-CM | POA: Diagnosis not present

## 2018-11-11 DIAGNOSIS — E034 Atrophy of thyroid (acquired): Secondary | ICD-10-CM | POA: Diagnosis not present

## 2018-11-15 ENCOUNTER — Other Ambulatory Visit: Payer: Self-pay

## 2018-11-15 ENCOUNTER — Ambulatory Visit
Admission: RE | Admit: 2018-11-15 | Discharge: 2018-11-15 | Disposition: A | Discharge status: Home / Self Care | Payer: Medicare Other | Source: Ambulatory Visit | Attending: Internal Medicine | Admitting: Internal Medicine

## 2018-11-15 ENCOUNTER — Other Ambulatory Visit: Payer: Self-pay | Admitting: Internal Medicine

## 2018-11-15 DIAGNOSIS — M545 Low back pain, unspecified: Secondary | ICD-10-CM

## 2018-11-15 DIAGNOSIS — M25552 Pain in left hip: Secondary | ICD-10-CM | POA: Insufficient documentation

## 2018-11-15 DIAGNOSIS — I119 Hypertensive heart disease without heart failure: Secondary | ICD-10-CM | POA: Diagnosis not present

## 2018-11-15 DIAGNOSIS — Z2821 Immunization not carried out because of patient refusal: Secondary | ICD-10-CM | POA: Diagnosis not present

## 2018-11-15 DIAGNOSIS — M5440 Lumbago with sciatica, unspecified side: Secondary | ICD-10-CM | POA: Diagnosis not present

## 2018-11-15 DIAGNOSIS — E041 Nontoxic single thyroid nodule: Secondary | ICD-10-CM | POA: Diagnosis not present

## 2018-11-17 DIAGNOSIS — E041 Nontoxic single thyroid nodule: Secondary | ICD-10-CM | POA: Diagnosis not present

## 2018-11-17 DIAGNOSIS — R29898 Other symptoms and signs involving the musculoskeletal system: Secondary | ICD-10-CM | POA: Diagnosis not present

## 2018-11-17 DIAGNOSIS — I119 Hypertensive heart disease without heart failure: Secondary | ICD-10-CM | POA: Diagnosis not present

## 2018-11-17 DIAGNOSIS — M792 Neuralgia and neuritis, unspecified: Secondary | ICD-10-CM | POA: Diagnosis not present

## 2018-12-15 DIAGNOSIS — Z20828 Contact with and (suspected) exposure to other viral communicable diseases: Secondary | ICD-10-CM | POA: Diagnosis not present

## 2019-02-13 DIAGNOSIS — M76892 Other specified enthesopathies of left lower limb, excluding foot: Secondary | ICD-10-CM | POA: Diagnosis not present

## 2019-02-13 DIAGNOSIS — I119 Hypertensive heart disease without heart failure: Secondary | ICD-10-CM | POA: Diagnosis not present

## 2019-02-13 DIAGNOSIS — M792 Neuralgia and neuritis, unspecified: Secondary | ICD-10-CM | POA: Diagnosis not present

## 2019-02-13 DIAGNOSIS — R29898 Other symptoms and signs involving the musculoskeletal system: Secondary | ICD-10-CM | POA: Diagnosis not present

## 2019-02-21 ENCOUNTER — Other Ambulatory Visit: Payer: Self-pay

## 2019-03-14 DIAGNOSIS — I10 Essential (primary) hypertension: Secondary | ICD-10-CM | POA: Diagnosis not present

## 2019-03-14 DIAGNOSIS — R5381 Other malaise: Secondary | ICD-10-CM | POA: Diagnosis not present

## 2019-03-14 DIAGNOSIS — D518 Other vitamin B12 deficiency anemias: Secondary | ICD-10-CM | POA: Diagnosis not present

## 2019-03-14 DIAGNOSIS — E7849 Other hyperlipidemia: Secondary | ICD-10-CM | POA: Diagnosis not present

## 2019-03-14 DIAGNOSIS — E034 Atrophy of thyroid (acquired): Secondary | ICD-10-CM | POA: Diagnosis not present

## 2019-04-28 IMAGING — CR DG CHEST 2V
1 series · 2 of 2 positions shown · non-contrast
Comparison: 06/19/2017

CLINICAL DATA: Chest pain.  Cough.  Weakness.  COPD.

EXAM:
CHEST - 2 VIEW

[Series 1: dg chest 2 view · 0.14mm/px · 2 of 2 slices shown]
[im 1/2]
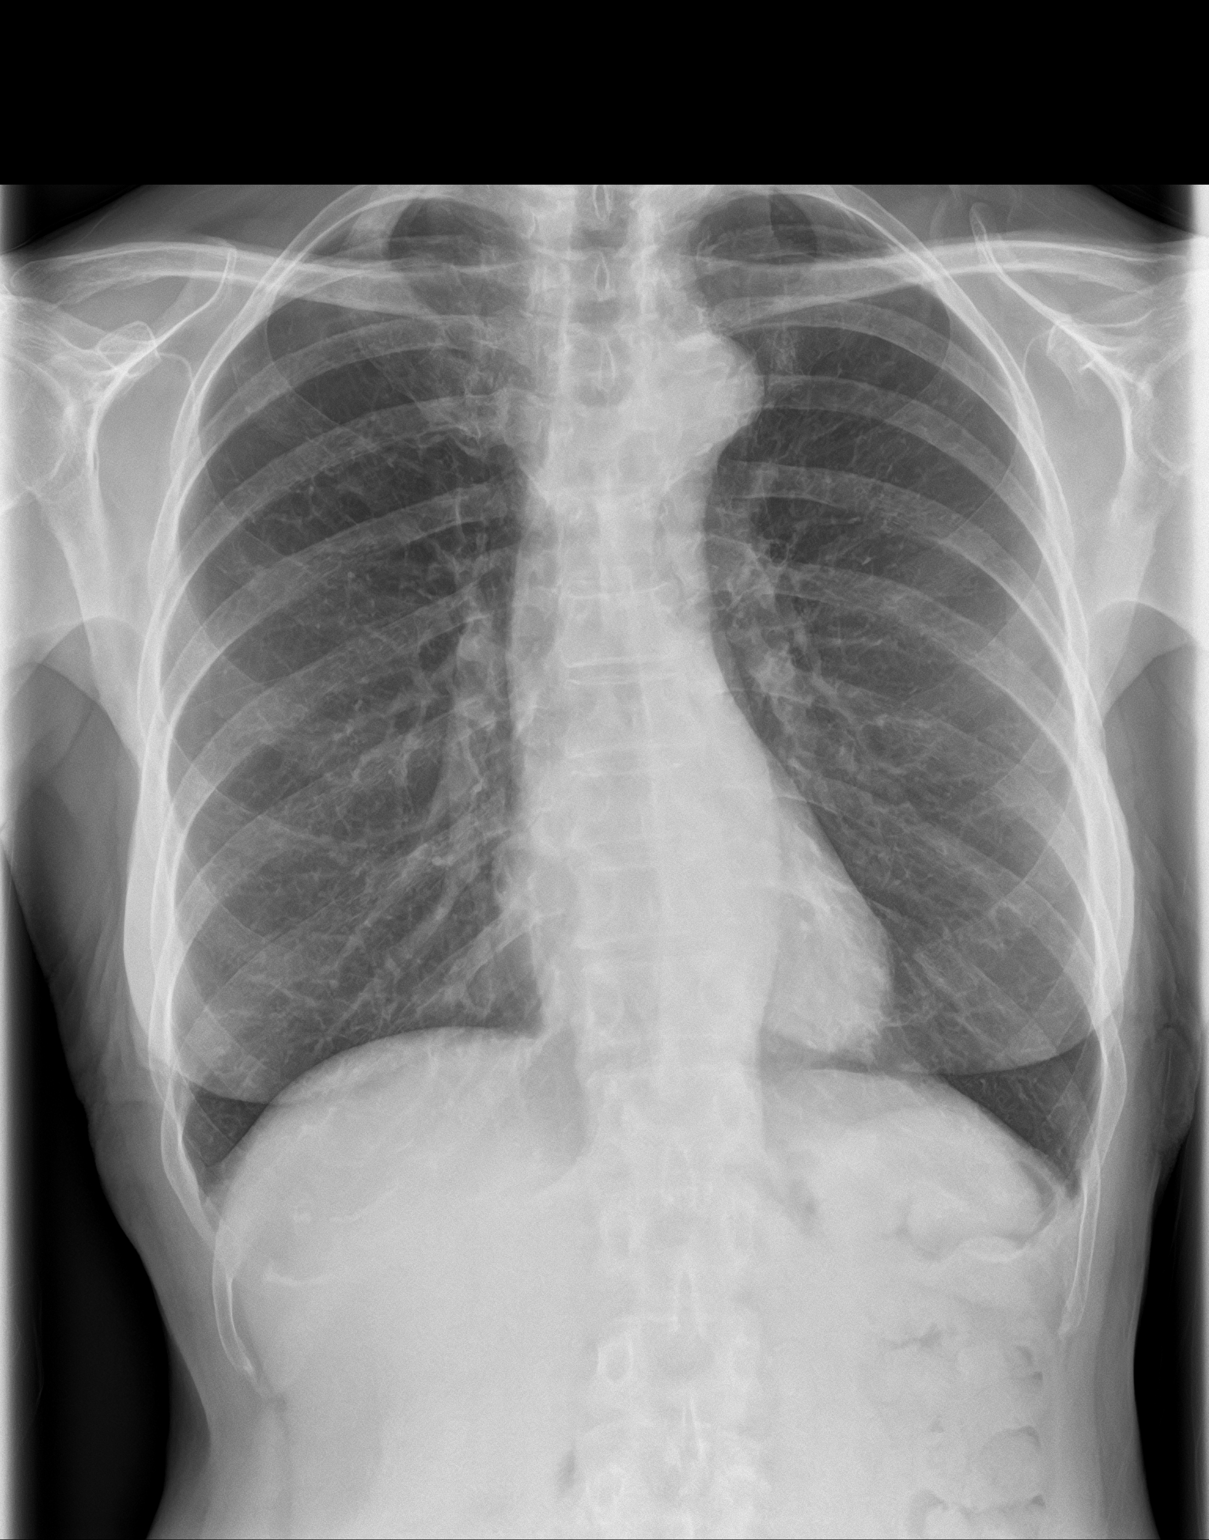
[im 2/2]
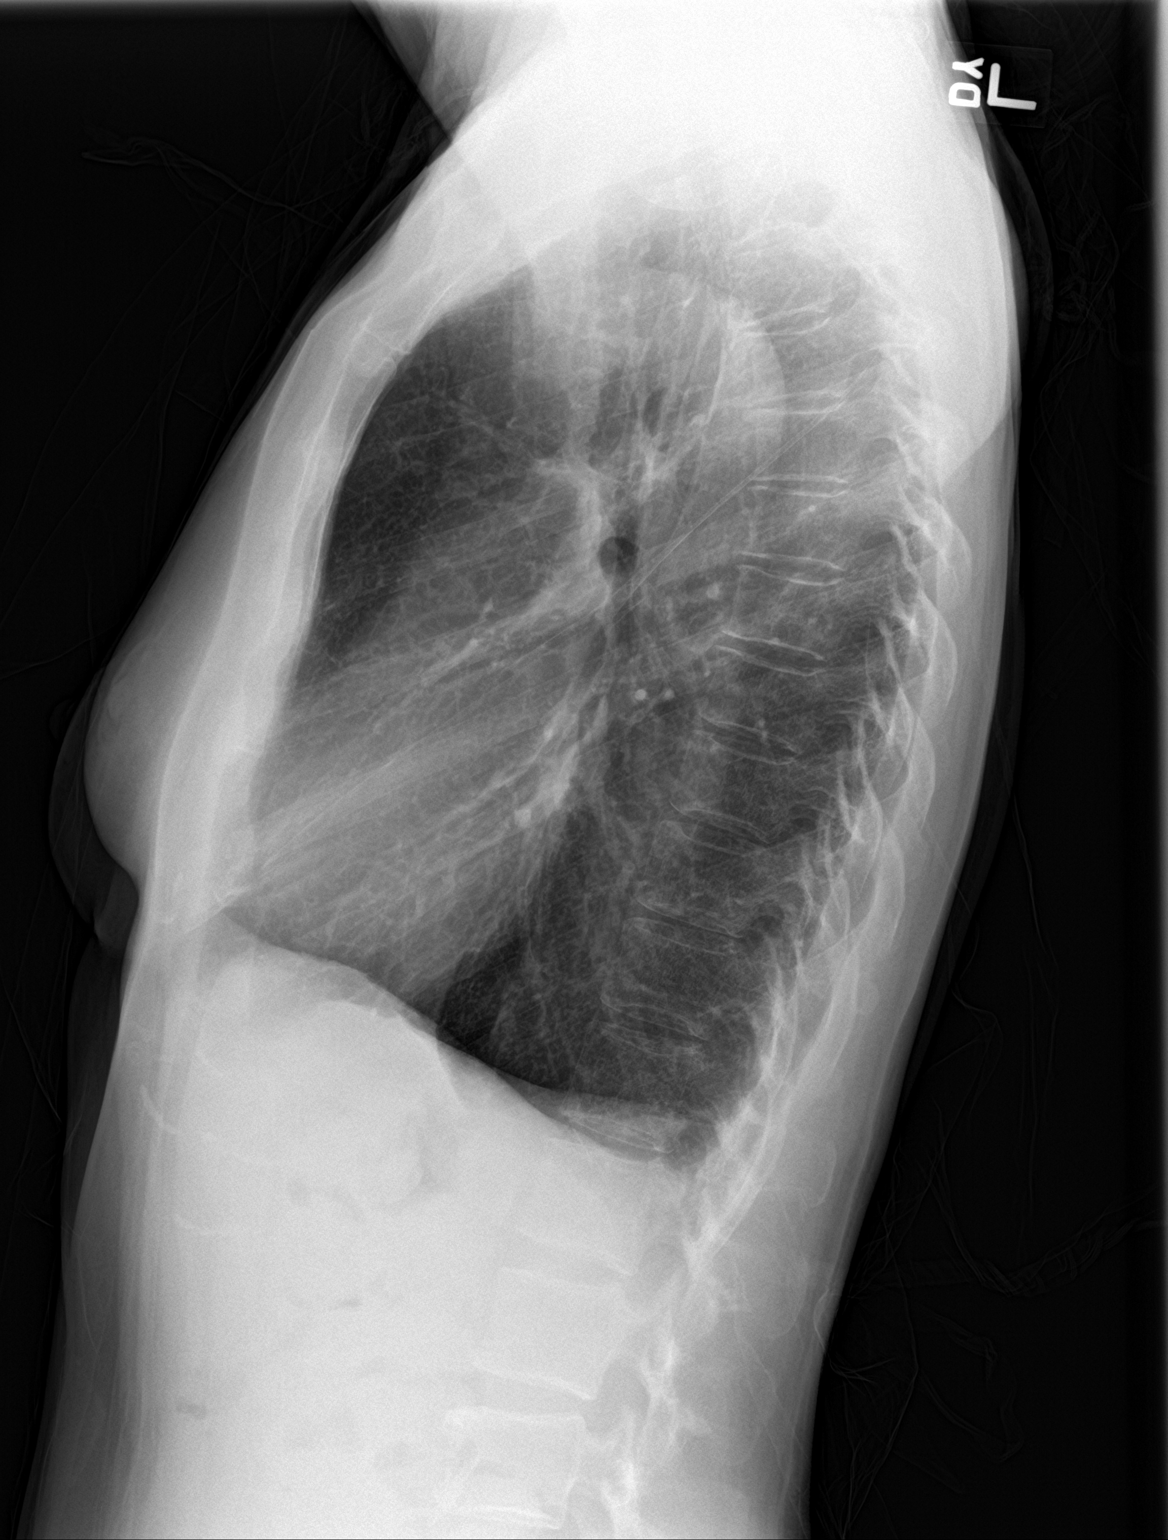

[2 of 2 positions shown; findings below may reference images not displayed]

FINDINGS: Heart size is normal. Aortic atherosclerosis. Pulmonary
hyperinflation is consistent with COPD. No evidence of pulmonary
infiltrate or edema. No evidence of pleural effusion.
IMPRESSION: COPD.  No active lung disease.

## 2019-05-15 DIAGNOSIS — E041 Nontoxic single thyroid nodule: Secondary | ICD-10-CM | POA: Diagnosis not present

## 2019-05-15 DIAGNOSIS — I10 Essential (primary) hypertension: Secondary | ICD-10-CM | POA: Diagnosis not present

## 2019-05-15 DIAGNOSIS — R29898 Other symptoms and signs involving the musculoskeletal system: Secondary | ICD-10-CM | POA: Diagnosis not present

## 2019-05-15 DIAGNOSIS — I119 Hypertensive heart disease without heart failure: Secondary | ICD-10-CM | POA: Diagnosis not present

## 2019-06-06 DIAGNOSIS — Z0189 Encounter for other specified special examinations: Secondary | ICD-10-CM | POA: Diagnosis not present

## 2019-06-13 DIAGNOSIS — M25562 Pain in left knee: Secondary | ICD-10-CM | POA: Diagnosis not present

## 2019-06-13 DIAGNOSIS — M792 Neuralgia and neuritis, unspecified: Secondary | ICD-10-CM | POA: Diagnosis not present

## 2019-06-13 DIAGNOSIS — E041 Nontoxic single thyroid nodule: Secondary | ICD-10-CM | POA: Diagnosis not present

## 2019-06-13 DIAGNOSIS — M76892 Other specified enthesopathies of left lower limb, excluding foot: Secondary | ICD-10-CM | POA: Diagnosis not present

## 2019-10-12 ENCOUNTER — Encounter: Payer: Self-pay | Admitting: Internal Medicine

## 2019-10-12 ENCOUNTER — Other Ambulatory Visit: Payer: Self-pay

## 2019-10-12 ENCOUNTER — Ambulatory Visit (INDEPENDENT_AMBULATORY_CARE_PROVIDER_SITE_OTHER): Payer: Medicare HMO | Admitting: Internal Medicine

## 2019-10-12 VITALS — BP 139/91 | HR 67 | Ht 63.5 in | Wt 119.0 lb

## 2019-10-12 DIAGNOSIS — Z7189 Other specified counseling: Secondary | ICD-10-CM

## 2019-10-12 DIAGNOSIS — E063 Autoimmune thyroiditis: Secondary | ICD-10-CM

## 2019-10-12 DIAGNOSIS — E038 Other specified hypothyroidism: Secondary | ICD-10-CM | POA: Diagnosis not present

## 2019-10-12 DIAGNOSIS — Z6820 Body mass index (BMI) 20.0-20.9, adult: Secondary | ICD-10-CM

## 2019-10-12 DIAGNOSIS — I10 Essential (primary) hypertension: Secondary | ICD-10-CM

## 2019-10-12 DIAGNOSIS — Z7185 Encounter for immunization safety counseling: Secondary | ICD-10-CM | POA: Insufficient documentation

## 2019-10-12 DIAGNOSIS — R634 Abnormal weight loss: Secondary | ICD-10-CM

## 2019-10-12 NOTE — Assessment & Plan Note (Deleted)
Patient has not lost any weight. Her weight is stable at 20.75 BMI

## 2019-10-12 NOTE — Assessment & Plan Note (Signed)
Patient does not want to take any vaccination. Her husband has a Curator syndrome

## 2019-10-12 NOTE — Assessment & Plan Note (Signed)
-   Today, the patient's blood pressure is well managed on present regime. - The patient will continue the current treatment regimen.  - I encouraged the patient to eat a low-sodium diet to help control blood pressure. - I encouraged the patient to live an active lifestyle and complete activities that increases heart rate to 85% target heart rate at least 5 times per week for one hour.      

## 2019-10-12 NOTE — Assessment & Plan Note (Signed)
We will increase levothyroxine to 100 mcg p.o. daily check blood test in 1 month

## 2019-10-12 NOTE — Progress Notes (Addendum)
Established Patient Office Visit  Subjective:  Patient ID: Suzanne Ritter, female    DOB: [birth year]  Age: 69 y.o. MRN: [id number]  CC:  Chief Complaint  Patient presents with  . Discuss thyroid regimen    Hypertension This is a chronic problem. The problem is controlled. Pertinent negatives include no anxiety, chest pain, neck pain or palpitations. Risk factors for coronary artery disease include obesity. Compliance problems include exercise.  There is no history of angina or CVA. There is no history of chronic renal disease.    Suzanne Ritter presents for hypothyroidism , gained weight  Of the legs.  Patient feels down at times.  No chest pain or shortness of breath. Past Medical History:  Diagnosis Date  . Hypertension   . Thyroid disease     Past Surgical History:  Procedure Laterality Date  . BRAIN SURGERY  2007  . CERVICAL CONE BIOPSY    . CESAREAN SECTION    . COLONOSCOPY WITH PROPOFOL N/A 10/26/2016   Procedure: COLONOSCOPY WITH PROPOFOL;  Surgeon: Lollie Sails, MD;  Location: [facility] ENDOSCOPY;  Service: Endoscopy;  Laterality: N/A;  . THYROID SURGERY      History reviewed. No pertinent family history.  Social History   Socioeconomic History  . Marital status: Married    Spouse name: Not on file  . Number of children: Not on file  . Years of education: Not on file  . Highest education level: Not on file  Occupational History  . Not on file  Tobacco Use  . Smoking status: Former Research scientist (life sciences)  . Smokeless tobacco: Never Used  Substance and Sexual Activity  . Alcohol use: No  . Drug use: No  . Sexual activity: Not on file  Other Topics Concern  . Not on file  Social History Narrative  . Not on file   Social Determinants of Health   Financial Resource Strain:   . Difficulty of Paying Living Expenses:   Food Insecurity:   . Worried About Charity fundraiser in the Last Year:   . Arboriculturist in the Last Year:   Transportation Needs:    . Film/video editor (Medical):   Marland Kitchen Lack of Transportation (Non-Medical):   Physical Activity:   . Days of Exercise per Week:   . Minutes of Exercise per Session:   Stress:   . Feeling of Stress :   Social Connections:   . Frequency of Communication with Friends and Family:   . Frequency of Social Gatherings with Friends and Family:   . Attends Religious Services:   . Active Member of Clubs or Organizations:   . Attends Archivist Meetings:   Marland Kitchen Marital Status:   Intimate Partner Violence:   . Fear of Current or Ex-Partner:   . Emotionally Abused:   Marland Kitchen Physically Abused:   . Sexually Abused:      Current Outpatient Medications:  .  hydrOXYzine (ATARAX/VISTARIL) 25 MG tablet, Take 1 tablet (25 mg total) by mouth 3 (three) times daily as needed for anxiety., Disp: 20 tablet, Rfl: 0 .  levothyroxine (SYNTHROID, LEVOTHROID) 100 MCG tablet, , Disp: , Rfl: 1 .  lisinopril-hydrochlorothiazide (PRINZIDE,ZESTORETIC) 10-12.5 MG tablet, Take 1 tablet by mouth daily., Disp: , Rfl:    Allergies  Allergen Reactions  . Naproxen Other (See Comments)    Other Reaction: GI Upset    ROS Review of Systems  Constitutional: Positive for fatigue. Negative for chills.  HENT: Negative for facial  swelling and postnasal drip.   Eyes: Negative for photophobia and pain.  Respiratory: Negative for cough and choking.   Cardiovascular: Negative for chest pain and palpitations.  Gastrointestinal: Negative for constipation.  Endocrine: Negative for polydipsia.  Genitourinary: Negative for frequency.  Musculoskeletal: Negative for neck pain.  Psychiatric/Behavioral: The patient is not hyperactive.       Objective:    Physical Exam Constitutional:      Appearance: Normal appearance.  HENT:     Head: Normocephalic.     Nose: Nose normal.  Eyes:     Pupils: Pupils are equal, round, and reactive to light.  Cardiovascular:     Rate and Rhythm: Normal rate.     Heart sounds: Normal  heart sounds.  Pulmonary:     Effort: Pulmonary effort is normal. No respiratory distress.     Breath sounds: No rhonchi.  Abdominal:     Hernia: No hernia is present.  Musculoskeletal:        General: No swelling.  Neurological:     General: No focal deficit present.     Mental Status: She is alert.     BP (!) 139/91   Pulse 67   Ht 5' 3.5" (1.613 m)   Wt 119 lb (54 kg)   BMI 20.75 kg/m  Wt Readings from Last 3 Encounters:  10/12/19 119 lb (54 kg)  05/24/18 115 lb (52.2 kg)  03/19/18 115 lb (52.2 kg)     Health Maintenance Due  Topic Date Due  . Hepatitis C Screening  Never done  . COVID-19 Vaccine (1) Never done  . TETANUS/TDAP  Never done  . MAMMOGRAM  Never done  . DEXA SCAN  Never done  . PNA vac Low Risk Adult (1 of 2 - PCV13) Never done    There are no preventive care reminders to display for this patient.  No results found for: TSH Lab Results  Component Value Date   WBC 6.3 05/24/2018   HGB 14.4 05/24/2018   HCT 43.0 05/24/2018   MCV 87.6 05/24/2018   PLT 194 05/24/2018   Lab Results  Component Value Date   NA 136 05/24/2018   K 3.6 05/24/2018   CO2 21 (L) 05/24/2018   GLUCOSE 156 (H) 05/24/2018   BUN 11 05/24/2018   CREATININE 0.70 05/24/2018   BILITOT 0.9 05/24/2018   ALKPHOS 63 05/24/2018   AST 35 05/24/2018   ALT 27 05/24/2018   PROT 7.9 05/24/2018   ALBUMIN 4.6 05/24/2018   CALCIUM 9.2 05/24/2018   ANIONGAP 15 05/24/2018   No results found for: CHOL No results found for: HDL No results found for: LDLCALC No results found for: TRIG No results found for: CHOLHDL No results found for: HGBA1C    Assessment & Plan:   Problem List Items Addressed This Visit      Cardiovascular and Mediastinum   Essential hypertension    - Today, the patient's blood pressure is well managed on present regime. - The patient will continue the current treatment regimen.  - I encouraged the patient to eat a low-sodium diet to help control blood  pressure. - I encouraged the patient to live an active lifestyle and complete activities that increases heart rate to 85% target heart rate at least 5 times per week for one hour.             Endocrine   Hypothyroidism due to Hashimoto's thyroiditis - Primary    We will increase levothyroxine to 100 mcg  p.o. daily check blood test in 1 month            Other   Vaccine counseling    Patient does not want to take any vaccination. Her husband has a Guillain-Barr syndrome      BMI 20.0-20.9, adult    Suggest high-protein diet      RESOLVED: Weight loss      No orders of the defined types were placed in this encounter.   Follow-up: Return in about 4 weeks (around 11/09/2019).    Cletis Athens, MD

## 2019-10-13 ENCOUNTER — Other Ambulatory Visit: Payer: Self-pay | Admitting: Internal Medicine

## 2019-10-13 DIAGNOSIS — Z6820 Body mass index (BMI) 20.0-20.9, adult: Secondary | ICD-10-CM | POA: Insufficient documentation

## 2019-10-13 NOTE — Assessment & Plan Note (Signed)
Suggest high-protein diet

## 2019-11-26 ENCOUNTER — Other Ambulatory Visit: Payer: Self-pay | Admitting: Internal Medicine

## 2019-12-25 IMAGING — CR LUMBAR SPINE - COMPLETE 4+ VIEW
1 series · 5 of 5 positions shown · non-contrast
Comparison: None.

CLINICAL DATA: Low back pain

EXAM:
LUMBAR SPINE - COMPLETE 4+ VIEW

[Series 1: dg lumbar spine complete 4 +v · 0.14mm/px · 5 of 5 slices shown]
[im 1/5]
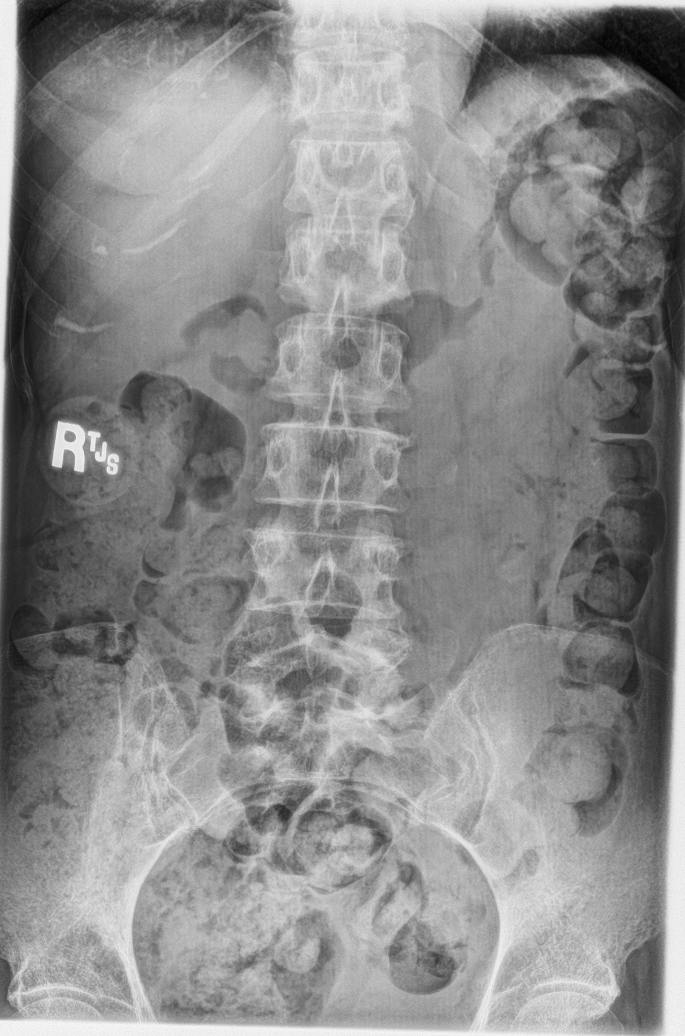
[im 2/5]
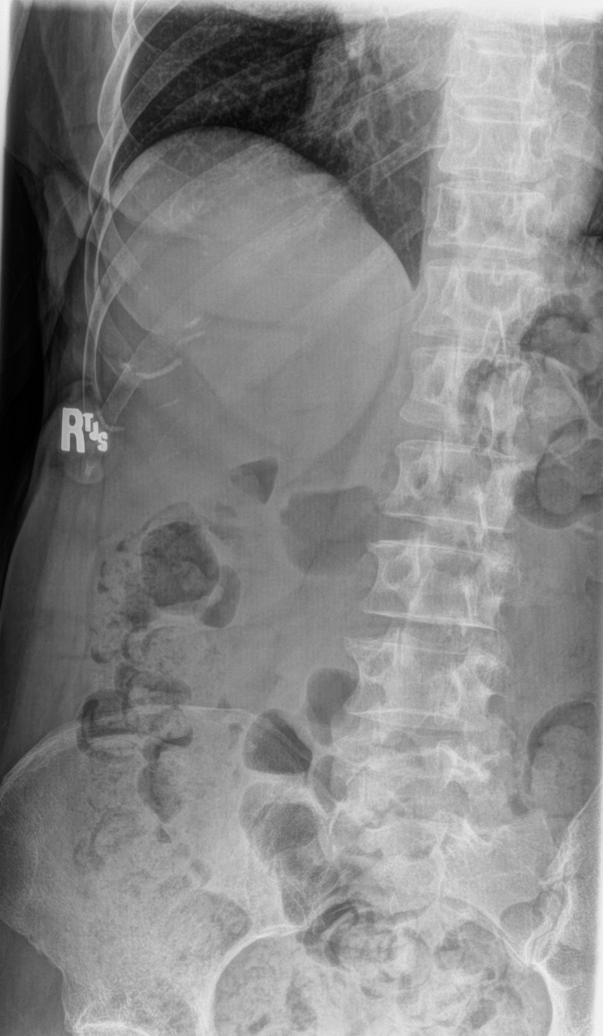
[im 3/5]
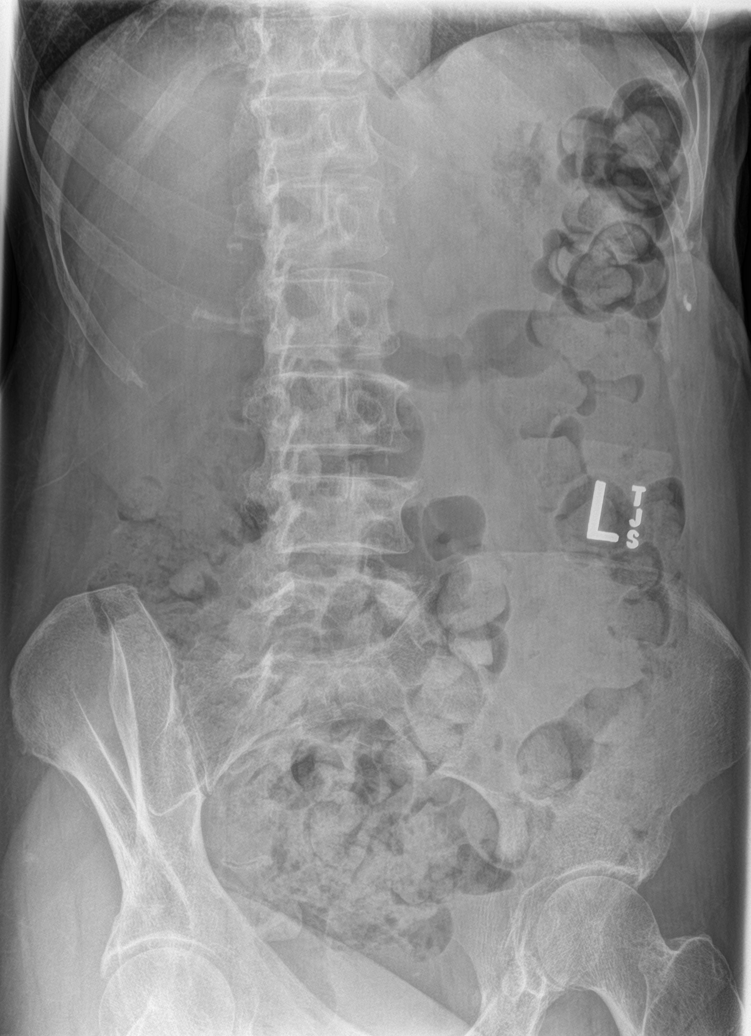
[im 4/5]
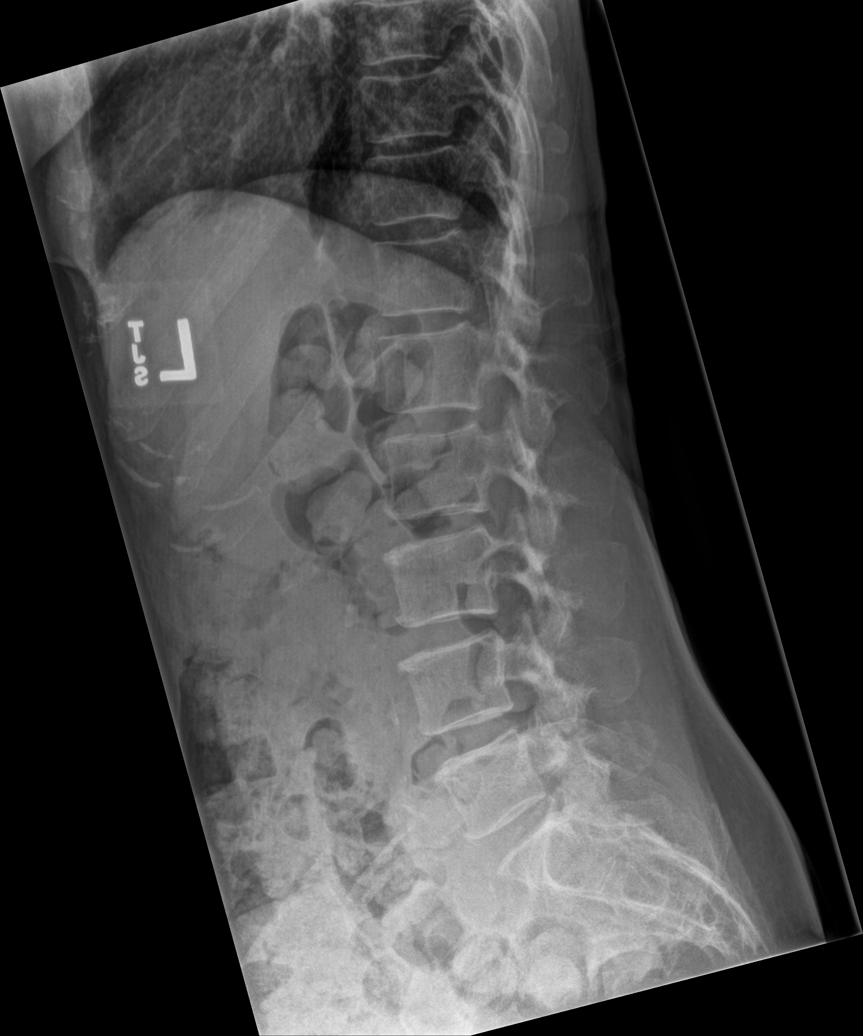
[im 5/5]
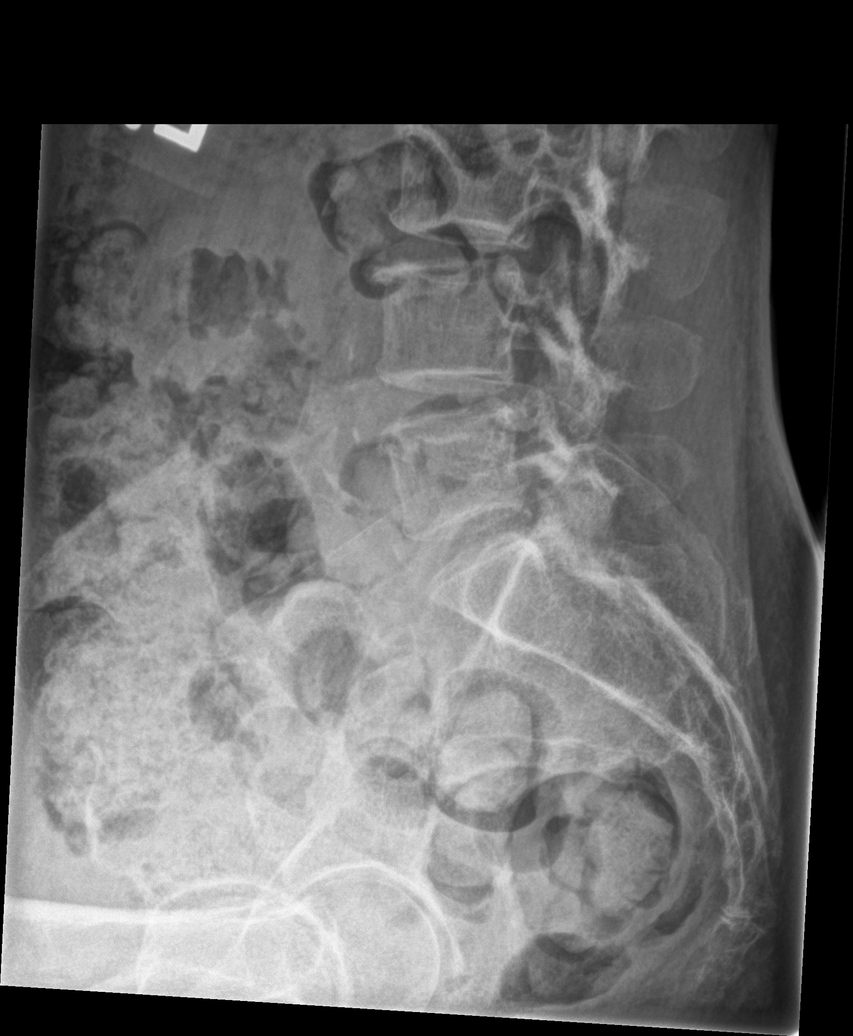

[5 of 5 positions shown; findings below may reference images not displayed]

FINDINGS: There is no evidence of lumbar spine fracture. Alignment is normal.
Intervertebral disc spaces are maintained.
IMPRESSION: Negative.

## 2019-12-26 ENCOUNTER — Other Ambulatory Visit: Payer: Self-pay

## 2019-12-26 ENCOUNTER — Other Ambulatory Visit (INDEPENDENT_AMBULATORY_CARE_PROVIDER_SITE_OTHER): Payer: Medicare HMO

## 2019-12-26 DIAGNOSIS — E063 Autoimmune thyroiditis: Secondary | ICD-10-CM

## 2019-12-26 DIAGNOSIS — I10 Essential (primary) hypertension: Secondary | ICD-10-CM

## 2019-12-26 DIAGNOSIS — Z6820 Body mass index (BMI) 20.0-20.9, adult: Secondary | ICD-10-CM | POA: Diagnosis not present

## 2019-12-26 DIAGNOSIS — E038 Other specified hypothyroidism: Secondary | ICD-10-CM

## 2019-12-26 DIAGNOSIS — R634 Abnormal weight loss: Secondary | ICD-10-CM

## 2019-12-27 LAB — CBC WITH DIFFERENTIAL/PLATELET
Absolute Monocytes: 370 cells/uL (ref 200–950)
Basophils Absolute: 91 cells/uL (ref 0–200)
Basophils Relative: 1.9 %
Eosinophils Absolute: 379 cells/uL (ref 15–500)
Eosinophils Relative: 7.9 %
HCT: 43.8 % (ref 35.0–45.0)
Hemoglobin: 14.5 g/dL (ref 11.7–15.5)
Lymphs Abs: 2021 cells/uL (ref 850–3900)
MCH: 29.7 pg (ref 27.0–33.0)
MCHC: 33.1 g/dL (ref 32.0–36.0)
MCV: 89.8 fL (ref 80.0–100.0)
MPV: 11.1 fL (ref 7.5–12.5)
Monocytes Relative: 7.7 %
Neutro Abs: 1939 cells/uL (ref 1500–7800)
Neutrophils Relative %: 40.4 %
Platelets: 275 10*3/uL (ref 140–400)
RBC: 4.88 10*6/uL (ref 3.80–5.10)
RDW: 12.1 % (ref 11.0–15.0)
Total Lymphocyte: 42.1 %
WBC: 4.8 10*3/uL (ref 3.8–10.8)

## 2019-12-27 LAB — LIPID PANEL
Cholesterol: 238 mg/dL — ABNORMAL HIGH (ref <200)
HDL: 63 mg/dL (ref >=50)
LDL Cholesterol (Calc): 153 mg/dL (calc) — ABNORMAL HIGH
Non-HDL Cholesterol (Calc): 175 mg/dL (calc) — ABNORMAL HIGH (ref <130)
Total CHOL/HDL Ratio: 3.8 (calc) (ref <5.0)
Triglycerides: 105 mg/dL (ref <150)

## 2019-12-27 LAB — COMPLETE METABOLIC PANEL WITH GFR
AG Ratio: 1.5 (calc) (ref 1.0–2.5)
ALT: 11 U/L (ref 6–29)
AST: 18 U/L (ref 10–35)
Albumin: 4.1 g/dL (ref 3.6–5.1)
Alkaline phosphatase (APISO): 58 U/L (ref 37–153)
BUN/Creatinine Ratio: 12 (calc) (ref 6–22)
BUN: 11 mg/dL (ref 7–25)
CO2: 27 mmol/L (ref 20–32)
Calcium: 9.4 mg/dL (ref 8.6–10.4)
Chloride: 101 mmol/L (ref 98–110)
Creat: 0.94 mg/dL — ABNORMAL HIGH (ref 0.60–0.93)
GFR, Est African American: 71 mL/min/{1.73_m2} (ref >=60)
GFR, Est Non African American: 61 mL/min/{1.73_m2} (ref >=60)
Globulin: 2.7 g/dL (calc) (ref 1.9–3.7)
Glucose, Bld: 76 mg/dL (ref 65–99)
Potassium: 4.5 mmol/L (ref 3.5–5.3)
Sodium: 139 mmol/L (ref 135–146)
Total Bilirubin: 0.6 mg/dL (ref 0.2–1.2)
Total Protein: 6.8 g/dL (ref 6.1–8.1)

## 2019-12-27 LAB — TSH: TSH: 0.02 mIU/L — ABNORMAL LOW (ref 0.40–4.50)

## 2020-01-05 ENCOUNTER — Encounter: Payer: Self-pay | Admitting: Family Medicine

## 2020-01-05 ENCOUNTER — Ambulatory Visit (INDEPENDENT_AMBULATORY_CARE_PROVIDER_SITE_OTHER): Payer: Medicare HMO | Admitting: Family Medicine

## 2020-01-05 ENCOUNTER — Other Ambulatory Visit: Payer: Self-pay

## 2020-01-05 VITALS — BP 130/85 | HR 72 | Ht 63.0 in | Wt 119.0 lb

## 2020-01-05 DIAGNOSIS — E063 Autoimmune thyroiditis: Secondary | ICD-10-CM | POA: Diagnosis not present

## 2020-01-05 DIAGNOSIS — E038 Other specified hypothyroidism: Secondary | ICD-10-CM | POA: Diagnosis not present

## 2020-01-05 DIAGNOSIS — Z0000 Encounter for general adult medical examination without abnormal findings: Secondary | ICD-10-CM | POA: Diagnosis not present

## 2020-01-05 DIAGNOSIS — Z7185 Encounter for immunization safety counseling: Secondary | ICD-10-CM | POA: Diagnosis not present

## 2020-01-05 DIAGNOSIS — I10 Essential (primary) hypertension: Secondary | ICD-10-CM | POA: Diagnosis not present

## 2020-01-05 DIAGNOSIS — E7800 Pure hypercholesterolemia, unspecified: Secondary | ICD-10-CM

## 2020-01-05 MED ORDER — LEVOTHYROXINE SODIUM 100 MCG PO TABS: 100.0000 ug | ORAL_TABLET | Freq: Once | ORAL | 0 refills | Status: DC

## 2020-01-05 NOTE — Patient Instructions (Addendum)
Understanding How COVID-19 Vaccines Work Updated Aug 24, 2019  What You Need to Know . COVID-19 vaccines are safe and effective. . You may have side effects after vaccination, but these are normal. . It typically takes two weeks after you are fully vaccinated for the body to build protection (immunity) against the virus that causes COVID-19. . If you are not vaccinated, find a vaccine. Keep taking all precautions until you are fully vaccinated. . If you are fully vaccinated, you can resume activities that you did prior to the pandemic. Learn more about what you can do when you have been fully vaccinated.  Vaccine sticker and vial The Immune System--the Body's Defense Against Infection To understand how COVID-19 vaccines work, it helps to first look at how our bodies fight illness. When germs, such as the virus that causes COVID-19, invade our bodies, they attack and multiply. This invasion, called an infection, is what causes illness. Our immune system uses several tools to fight infection. Blood contains red cells, which carry oxygen to tissues and organs, and white or immune cells, which fight infection. Different types of white blood cells fight infection in different ways:  Marland Kitchen Macrophages are white blood cells that swallow up and digest germs and dead or dying cells. The macrophages leave behind parts of the invading germs, called "antigens". The body identifies antigens as dangerous and stimulates antibodies to attack them. . B-lymphocytes are defensive white blood cells. They produce antibodies that attack the pieces of the virus left behind by the macrophages. . T-lymphocytes are another type of defensive white blood cell. They attack cells in the body that have already been infected. . The first time a person is infected with the virus that causes COVID-19, it can take several days or weeks for their body to make and use all the germ-fighting tools needed to get over the infection. After the  infection, the person's immune system remembers what it learned about how to protect the body against that disease.  The body keeps a few T-lymphocytes, called "memory cells," that go into action quickly if the body encounters the same virus again. When the familiar antigens are detected, B-lymphocytes produce antibodies to attack them. Experts are still learning how long these memory cells protect a person against the virus that causes COVID-19.  How COVID-19 Vaccines Work COVID-19 vaccines help our bodies develop immunity to the virus that causes COVID-19 without [place] having to get the illness.  COVID vaccine Different types of vaccines work in different ways to offer protection. But with all types of vaccines, the body is left with a supply of "memory" T-lymphocytes as well as B-lymphocytes that will remember how to fight that virus in the future.  It typically takes a few weeks after vaccination for the body to produce T-lymphocytes and B-lymphocytes. Therefore, it is possible that a person could be infected with the virus that causes COVID-19 just before or just after vaccination and then get sick because the vaccine did not have enough time to provide protection.  Sometimes after vaccination, the process of building immunity can cause symptoms, such as fever. These symptoms are normal and are signs that the body is building immunity.  Learn more about getting your vaccine.  Types of Vaccines Currently, there are three main types of COVID-19 vaccines that are authorized and recommended or undergoing large-scale (Phase 3) clinical trials in the [place].  Below is a description of how each type of vaccine prompts our bodies to recognize and  protect [place] from the virus that causes COVID-19. None of these vaccines can give you COVID-19.  Marland Kitchen mRNA vaccines contain material from the virus that causes COVID-19 that gives our cells instructions for how to make a harmless protein that is unique to  the virus. After our cells make copies of the protein, they destroy the genetic material from the vaccine. Our bodies recognize that the protein should not be there and build T-lymphocytes and B-lymphocytes that will remember how to fight the virus that causes COVID-19 if we are infected in the future. . Protein subunit vaccines include harmless pieces (proteins) of the virus that causes COVID-19 instead of the entire germ. Once vaccinated, our bodies recognize that the protein should not be there and build T-lymphocytes and antibodies that will remember how to fight the virus that causes COVID-19 if we are infected in the future. . Vector vaccines contain a modified version of a different virus than the one that causes COVID-19. Inside the shell of the modified virus, there is material from the virus that causes COVID-19. This is called a "viral vector." Once the viral vector is inside our cells, the genetic material gives cells instructions to make a protein that is unique to the virus that causes COVID-19. Using these instructions, our cells make copies of the protein. This prompts our bodies to build T-lymphocytes and B-lymphocytes that will remember how to fight that virus if we are infected in the future.  Some COVID-19 Vaccines Require More Than One Shot To be fully vaccinated, you will need two shots of some COVID-19 vaccines.  . Two shots: If you get a COVID-19 vaccine that requires two shots, you are considered fully vaccinated two weeks after your second shot. Pfizer-BioNTech and Moderna COVID-19 vaccines require two shots. . One Shot: If you get a COVID-19 vaccine that requires one shot, you are considered fully vaccinated two weeks after your shot. [place] COVID-19 vaccine only requires one shot. . If it has been less than two weeks since your shot, or if you still need to get your second shot, you are NOT fully protected. Keep taking steps to protect yourself and others  until you are fully vaccinated (two weeks after your final shot). . Booster: A booster shot has been approved for those that have received the [place] vaccine, and it is recommended for those that meet CDC guidelines for it.  Do I still need to get the COVID-19 vaccine if I was diagnosed with and recovered from COVID-19? Yes, you should be vaccinated regardless of whether you already had COVID-19 because: . Research has not yet shown how long you are protected from getting COVID-19 again after you recover from COVID-19. . Vaccination helps protect you even if you've already had COVID-19. . Evidence is emerging that people get better protection by being fully vaccinated compared with having had COVID-19. One study showed that unvaccinated people who already had COVID-19 are more than 2 times as likely than fully vaccinated people to get COVID-19 again.  If you were treated for COVID-19 with monoclonal antibodies or convalescent plasma, you should wait 90 days before getting a COVID-19 vaccine. Talk to your doctor if you are unsure what treatments you received or if you have more questions about getting a COVID-19 vaccine.  Where to get the Vaccine You can contact [facility] to schedule your vaccination!  You can also get a COVID-19 vaccine from any participating pharmacy.  You can also register for  a vaccine with [place] at:  [place] - ages 61 and older; Phone: [phone]   [facility] - ages 25 and older; Phone: [place] - ages 80 and above; Phone: [phone]  For additional locations and clinics for ages 35 and younger, please visit:  [email or web address]  Information from:  [email or web address] https://www.cdc.gov/coronavirus/2019-ncov/vaccines/faq.html#:~:text=No.,the%20criteria%20before%20getting%20vaccinated

## 2020-01-05 NOTE — Assessment & Plan Note (Signed)
Labs .02 meds not adjusted for now, she tried to go down to 50 mcg but did not feel well.

## 2020-01-05 NOTE — Assessment & Plan Note (Signed)
Patient's blood pressure is within the desired range. Medication side effects include: no side effects noted On no meds for BP and needs none at this time. Continue current medications.

## 2020-01-05 NOTE — Assessment & Plan Note (Signed)
LDL 154 this visit, member does not want to consider medication therapy at this time, opting for diet therapy and repeat results in 6 months.

## 2020-01-05 NOTE — Assessment & Plan Note (Signed)
Member here today for yearly exam, Has not had a mammogram, Dexa scan or Colonoscopy in the recommended timeframe, pt had TDAP in 2015.

## 2020-01-05 NOTE — Progress Notes (Signed)
Established Patient Office Visit  SUBJECTIVE:  Subjective  Patient ID: Suzanne Ritter, female    DOB: 1950-10-15  Age: 69 y.o. MRN: [id number]  CC:  Chief Complaint  Patient presents with  . Annual Exam    HPI Suzanne Ritter is a 69 y.o. female presenting today for for an annual exam.   She underwent labs on 12/26/2019. CBC was within normal limits. CMP was within normal limits except for creatinine 0.94. Lipid panel revealed cholesterol 238, HDL 63, triglycerides 105, LDL 153. TSH was 0.02.   She is doing well overall. She continues to take her thyroid medication as prescribed. She does occasionally get some headaches if she accidentally skips a dose.    She is a former smoker. It has been a few years since her mammogram. She is due for a bone density screening. She is due for a colonoscopy. She has not seen an optometrist in some time.   She is not interested in getting a influenza vaccine.    Past Medical History:  Diagnosis Date  . Hypertension   . Thyroid disease     Past Surgical History:  Procedure Laterality Date  . BRAIN SURGERY  2007  . CERVICAL CONE BIOPSY    . CESAREAN SECTION    . COLONOSCOPY WITH PROPOFOL N/A 10/26/2016   Procedure: COLONOSCOPY WITH PROPOFOL;  Surgeon: Lollie Sails, MD;  Location: [facility] ENDOSCOPY;  Service: Endoscopy;  Laterality: N/A;  . THYROID SURGERY      Family History  Problem Relation Age of Onset  . Cancer Mother        kidney    Social History   Socioeconomic History  . Marital status: Married    Spouse name: Not on file  . Number of children: Not on file  . Years of education: Not on file  . Highest education level: Not on file  Occupational History  . Not on file  Tobacco Use  . Smoking status: Former Research scientist (life sciences)  . Smokeless tobacco: Never Used  Substance and Sexual Activity  . Alcohol use: No  . Drug use: No  . Sexual activity: Not on file  Other Topics Concern  . Not on file  Social History  Narrative  . Not on file   Social Determinants of Health   Financial Resource Strain:   . Difficulty of Paying Living Expenses: Not on file  Food Insecurity:   . Worried About Charity fundraiser in the Last Year: Not on file  . Ran Out of Food in the Last Year: Not on file  Transportation Needs:   . Lack of Transportation (Medical): Not on file  . Lack of Transportation (Non-Medical): Not on file  Physical Activity:   . Days of Exercise per Week: Not on file  . Minutes of Exercise per Session: Not on file  Stress:   . Feeling of Stress : Not on file  Social Connections:   . Frequency of Communication with Friends and Family: Not on file  . Frequency of Social Gatherings with Friends and Family: Not on file  . Attends Religious Services: Not on file  . Active Member of Clubs or Organizations: Not on file  . Attends Archivist Meetings: Not on file  . Marital Status: Not on file  Intimate Partner Violence:   . Fear of Current or Ex-Partner: Not on file  . Emotionally Abused: Not on file  . Physically Abused: Not on file  . Sexually Abused: Not on  file     Current Outpatient Medications:  .  hydrOXYzine (ATARAX/VISTARIL) 25 MG tablet, Take 1 tablet (25 mg total) by mouth 3 (three) times daily as needed for anxiety., Disp: 20 tablet, Rfl: 0 .  levothyroxine (SYNTHROID) 100 MCG tablet, Take 1 tablet (100 mcg total) by mouth once for 1 dose., Disp: 90 tablet, Rfl: 0 .  lisinopril-hydrochlorothiazide (ZESTORETIC) 10-12.5 MG tablet, TAKE 1 TABLET BY MOUTH DAILY, Disp: 30 tablet, Rfl: 6   Allergies  Allergen Reactions  . Naproxen Other (See Comments)    Other Reaction: GI Upset    ROS Review of Systems  Constitutional: Negative.   HENT: Negative.   Eyes: Negative.   Respiratory: Negative.   Cardiovascular: Negative.   Gastrointestinal: Negative.   Endocrine: Negative.   Genitourinary: Negative.   Musculoskeletal: Negative.   Skin: Negative.     Allergic/Immunologic: Negative.   Neurological: Positive for headaches.  Hematological: Negative.   Psychiatric/Behavioral: Negative.   All other systems reviewed and are negative.    OBJECTIVE:    Physical Exam Vitals reviewed.  Constitutional:      Appearance: Normal appearance.  HENT:     Mouth/Throat:     Mouth: Mucous membranes are moist.  Eyes:     Pupils: Pupils are equal, round, and reactive to light.  Neck:     Vascular: No carotid bruit.  Cardiovascular:     Rate and Rhythm: Normal rate and regular rhythm.     Pulses: Normal pulses.     Heart sounds: Normal heart sounds.  Pulmonary:     Effort: Pulmonary effort is normal.     Breath sounds: Normal breath sounds.  Abdominal:     General: Bowel sounds are normal.     Palpations: Abdomen is soft. There is no hepatomegaly, splenomegaly or mass.     Tenderness: There is no abdominal tenderness.     Hernia: No hernia is present.  Musculoskeletal:        General: No tenderness.     Cervical back: Neck supple.     Right lower leg: No edema.     Left lower leg: No edema.  Skin:    Findings: No rash.  Neurological:     Mental Status: She is alert and oriented to person, place, and time.     Motor: No weakness.  Psychiatric:        Mood and Affect: Mood and affect normal.        Behavior: Behavior normal.     BP 130/85   Pulse 72   Ht 5\' 3"  (1.6 m)   Wt 119 lb (54 kg)   BMI 21.08 kg/m  Wt Readings from Last 3 Encounters:  01/05/20 119 lb (54 kg)  10/12/19 119 lb (54 kg)  05/24/18 115 lb (52.2 kg)    Health Maintenance Due  Topic Date Due  . Hepatitis C Screening  Never done  . TETANUS/TDAP  Never done  . MAMMOGRAM  Never done  . DEXA SCAN  Never done  . PNA vac Low Risk Adult (1 of 2 - PCV13) Never done  . INFLUENZA VACCINE  Never done    There are no preventive care reminders to display for this patient.  CBC Latest Ref Rng & Units 12/26/2019 05/24/2018 03/19/2018  WBC 3.8 - 10.8 Thousand/uL  4.8 6.3 4.7  Hemoglobin 11.7 - 15.5 g/dL 14.5 14.4 13.8  Hematocrit 35 - 45 % 43.8 43.0 41.8  Platelets 140 - 400 Thousand/uL 275 194 196  CMP Latest Ref Rng & Units 12/26/2019 05/24/2018 03/19/2018  Glucose 65 - 99 mg/dL 76 156(H) 95  BUN 7 - 25 mg/dL 11 11 18   Creatinine 0.60 - 0.93 mg/dL 0.94(H) 0.70 0.90  Sodium 135 - 146 mmol/L 139 136 141  Potassium 3.5 - 5.3 mmol/L 4.5 3.6 3.3(L)  Chloride 98 - 110 mmol/L 101 100 107  CO2 20 - 32 mmol/L 27 21(L) 28  Calcium 8.6 - 10.4 mg/dL 9.4 9.2 9.0  Total Protein 6.1 - 8.1 g/dL 6.8 7.9 7.3  Total Bilirubin 0.2 - 1.2 mg/dL 0.6 0.9 0.9  Alkaline Phos 38 - 126 U/L - 63 60  AST 10 - 35 U/L 18 35 21  ALT 6 - 29 U/L 11 27 17     Lab Results  Component Value Date   TSH 0.02 (L) 12/26/2019   Lab Results  Component Value Date   ALBUMIN 4.6 05/24/2018   ANIONGAP 15 05/24/2018   Lab Results  Component Value Date   CHOL 238 (H) 12/26/2019   HDL 63 12/26/2019   LDLCALC 153 (H) 12/26/2019   CHOLHDL 3.8 12/26/2019   Lab Results  Component Value Date   TRIG 105 12/26/2019   No results found for: HGBA1C    ASSESSMENT & PLAN:   Problem List Items Addressed This Visit      Cardiovascular and Mediastinum   Essential hypertension    Patient's blood pressure is within the desired range. Medication side effects include: no side effects noted On no meds for BP and needs none at this time. Continue current medications.         Endocrine   Hypothyroidism due to Hashimoto's thyroiditis    Labs .02 meds not adjusted for now, she tried to go down to 50 mcg but did not feel well.         Other   Vaccine counseling   Pure hypercholesterolemia    LDL 154 this visit, member does not want to consider medication therapy at this time, opting for diet therapy and repeat results in 6 months.       PE (physical exam), annual - Primary    Member here today for yearly exam, Has not had a mammogram, Dexa scan or Colonoscopy in the recommended  timeframe, pt had TDAP in 2015.        Relevant Orders   DG Bone Density   MM 3D SCREEN BREAST BILATERAL      Meds ordered this encounter  Medications  . levothyroxine (SYNTHROID) 100 MCG tablet    Sig: Take 1 tablet (100 mcg total) by mouth once for 1 dose.    Dispense:  90 tablet    Refill:  0    Follow-up: Return in about 3 months (around 04/06/2020) for F/U: HTN check.    Beckie Salts, [place] [address] [place], [place] [address]   By signing my name below, I, [employer], attest that this documentation has been prepared under the direction and in the presence of Beckie Salts, FNP Electronically Signed: Beckie Salts, [place] 01/11/20, 8:38 AM  I personally performed the services described in this documentation, which was SCRIBED in my presence. The recorded information has been reviewed and considered accurate. It has been edited as necessary during review. Beckie Salts, FNP

## 2020-01-09 ENCOUNTER — Other Ambulatory Visit: Payer: Self-pay

## 2020-01-09 ENCOUNTER — Ambulatory Visit (INDEPENDENT_AMBULATORY_CARE_PROVIDER_SITE_OTHER): Payer: Medicare Other | Admitting: Internal Medicine

## 2020-01-09 DIAGNOSIS — Z23 Encounter for immunization: Secondary | ICD-10-CM

## 2020-01-30 ENCOUNTER — Ambulatory Visit (INDEPENDENT_AMBULATORY_CARE_PROVIDER_SITE_OTHER): Payer: Medicare HMO | Admitting: Internal Medicine

## 2020-01-30 ENCOUNTER — Other Ambulatory Visit: Payer: Self-pay

## 2020-01-30 DIAGNOSIS — Z23 Encounter for immunization: Secondary | ICD-10-CM

## 2020-04-05 ENCOUNTER — Other Ambulatory Visit: Payer: Self-pay

## 2020-04-05 ENCOUNTER — Ambulatory Visit (INDEPENDENT_AMBULATORY_CARE_PROVIDER_SITE_OTHER): Payer: Medicare HMO | Admitting: Family Medicine

## 2020-04-05 ENCOUNTER — Encounter: Payer: Self-pay | Admitting: Family Medicine

## 2020-04-05 VITALS — BP 132/87 | HR 77 | Ht 63.0 in | Wt 117.3 lb

## 2020-04-05 DIAGNOSIS — H8110 Benign paroxysmal vertigo, unspecified ear: Secondary | ICD-10-CM | POA: Diagnosis not present

## 2020-04-05 DIAGNOSIS — E063 Autoimmune thyroiditis: Secondary | ICD-10-CM | POA: Diagnosis not present

## 2020-04-05 DIAGNOSIS — E038 Other specified hypothyroidism: Secondary | ICD-10-CM | POA: Diagnosis not present

## 2020-04-05 DIAGNOSIS — R42 Dizziness and giddiness: Secondary | ICD-10-CM | POA: Insufficient documentation

## 2020-04-05 DIAGNOSIS — E7800 Pure hypercholesterolemia, unspecified: Secondary | ICD-10-CM | POA: Diagnosis not present

## 2020-04-05 NOTE — Assessment & Plan Note (Addendum)
Patient had an episode of Dizziness yesterday morning after waking from sleep. She made her way to the restroom but had to be helped back to bed. She does not recall rapid heart rate, no weakness or slurred speech recalled. She did have flushing after the episode that lasted about 1 hour but she remained somewhat dizzy for the rest of the day. Plan- ECG wnl today, patient to buy OTC meclizine in case sx return.

## 2020-04-05 NOTE — Assessment & Plan Note (Signed)
3 months ago thyroid level was .02, patient is euthyroid and has attempted to come down to 50 mcg of Levothyroxine in the past but did not tolerate well so she is back up to 100 mcg. Plan- I am checking TSH today and she is backing her Levo down to 75 mcg.

## 2020-04-05 NOTE — Assessment & Plan Note (Signed)
Patient had an episode of Dizziness yesterday morning after waking from sleep. She made her way to the restroom but had to be helped back to bed. She does not recall rapid heart rate, no weakness or slurred speech recalled. She did have flushing after the episode that lasted about 1 hour but she remained somewhat dizzy for the rest of the day.

## 2020-04-05 NOTE — Progress Notes (Signed)
Established Patient Office Visit  SUBJECTIVE:  Subjective  Patient ID: Suzanne Ritter, female    DOB: Jul 26, 1950  Age: 70 y.o. MRN: [id number]  CC:  Chief Complaint  Patient presents with  . Dizziness    Patient complains of dizziness and weakness started yesterday    HPI Suzanne Ritter is a 70 y.o. female presenting today for     Past Medical History:  Diagnosis Date  . Hypertension   . Thyroid disease     Past Surgical History:  Procedure Laterality Date  . BRAIN SURGERY  2007  . CERVICAL CONE BIOPSY    . CESAREAN SECTION    . COLONOSCOPY WITH PROPOFOL N/A 10/26/2016   Procedure: COLONOSCOPY WITH PROPOFOL;  Surgeon: Lollie Sails, MD;  Location: [facility] ENDOSCOPY;  Service: Endoscopy;  Laterality: N/A;  . THYROID SURGERY      Family History  Problem Relation Age of Onset  . Cancer Mother        kidney    Social History   Socioeconomic History  . Marital status: Married    Spouse name: Not on file  . Number of children: Not on file  . Years of education: Not on file  . Highest education level: Not on file  Occupational History  . Not on file  Tobacco Use  . Smoking status: Former Research scientist (life sciences)  . Smokeless tobacco: Never Used  Substance and Sexual Activity  . Alcohol use: No  . Drug use: No  . Sexual activity: Not on file  Other Topics Concern  . Not on file  Social History Narrative  . Not on file   Social Determinants of Health   Financial Resource Strain: Not on file  Food Insecurity: Not on file  Transportation Needs: Not on file  Physical Activity: Not on file  Stress: Not on file  Social Connections: Not on file  Intimate Partner Violence: Not on file     Current Outpatient Medications:  .  hydrOXYzine (ATARAX/VISTARIL) 25 MG tablet, Take 1 tablet (25 mg total) by mouth 3 (three) times daily as needed for anxiety., Disp: 20 tablet, Rfl: 0 .  levothyroxine (SYNTHROID) 100 MCG tablet, Take 1 tablet (100 mcg total) by mouth  once for 1 dose., Disp: 90 tablet, Rfl: 0 .  lisinopril-hydrochlorothiazide (ZESTORETIC) 10-12.5 MG tablet, TAKE 1 TABLET BY MOUTH DAILY, Disp: 30 tablet, Rfl: 6   Allergies  Allergen Reactions  . Naproxen Other (See Comments)    Other Reaction: GI Upset    ROS Review of Systems  Constitutional: Negative.   HENT: Negative.   Respiratory: Negative.   Musculoskeletal: Negative.   Skin: Negative.   Neurological: Positive for dizziness. Negative for tremors, seizures, syncope, facial asymmetry, speech difficulty, weakness, light-headedness, numbness and headaches.  Psychiatric/Behavioral: Negative.      OBJECTIVE:    Physical Exam Vitals and nursing note reviewed.  Constitutional:      Appearance: Normal appearance.  HENT:     Right Ear: Tympanic membrane normal.     Left Ear: Tympanic membrane normal.     Nose: Nose normal.     Mouth/Throat:     Mouth: Mucous membranes are moist.  Eyes:     Pupils: Pupils are equal, round, and reactive to light.  Cardiovascular:     Rate and Rhythm: Normal rate and regular rhythm.  Musculoskeletal:        General: Normal range of motion.  Skin:    General: Skin is warm.  Capillary Refill: Capillary refill takes less than 2 seconds.  Neurological:     General: No focal deficit present.     Mental Status: She is alert and oriented to person, place, and time. Mental status is at baseline.  Psychiatric:        Mood and Affect: Mood normal.     BP 132/87   Pulse 77   Ht 5\' 3"  (1.6 m)   Wt 117 lb 4.8 oz (53.2 kg)   BMI 20.78 kg/m  Wt Readings from Last 3 Encounters:  04/05/20 117 lb 4.8 oz (53.2 kg)  01/05/20 119 lb (54 kg)  10/12/19 119 lb (54 kg)    Health Maintenance Due  Topic Date Due  . Hepatitis C Screening  Never done  . TETANUS/TDAP  Never done  . MAMMOGRAM  Never done  . DEXA SCAN  Never done  . PNA vac Low Risk Adult (1 of 2 - PCV13) Never done  . INFLUENZA VACCINE  Never done    There are no preventive care  reminders to display for this patient.  CBC Latest Ref Rng & Units 12/26/2019 05/24/2018 03/19/2018  WBC 3.8 - 10.8 Thousand/uL 4.8 6.3 4.7  Hemoglobin 11.7 - 15.5 g/dL 14.5 14.4 13.8  Hematocrit 35.0 - 45.0 % 43.8 43.0 41.8  Platelets 140 - 400 Thousand/uL 275 194 196   CMP Latest Ref Rng & Units 12/26/2019 05/24/2018 03/19/2018  Glucose 65 - 99 mg/dL 76 156(H) 95  BUN 7 - 25 mg/dL 11 11 18   Creatinine 0.60 - 0.93 mg/dL 0.94(H) 0.70 0.90  Sodium 135 - 146 mmol/L 139 136 141  Potassium 3.5 - 5.3 mmol/L 4.5 3.6 3.3(L)  Chloride 98 - 110 mmol/L 101 100 107  CO2 20 - 32 mmol/L 27 21(L) 28  Calcium 8.6 - 10.4 mg/dL 9.4 9.2 9.0  Total Protein 6.1 - 8.1 g/dL 6.8 7.9 7.3  Total Bilirubin 0.2 - 1.2 mg/dL 0.6 0.9 0.9  Alkaline Phos 38 - 126 U/L - 63 60  AST 10 - 35 U/L 18 35 21  ALT 6 - 29 U/L 11 27 17     Lab Results  Component Value Date   TSH 0.02 (L) 12/26/2019   Lab Results  Component Value Date   ALBUMIN 4.6 05/24/2018   ANIONGAP 15 05/24/2018   Lab Results  Component Value Date   CHOL 238 (H) 12/26/2019   HDL 63 12/26/2019   LDLCALC 153 (H) 12/26/2019   CHOLHDL 3.8 12/26/2019   Lab Results  Component Value Date   TRIG 105 12/26/2019   No results found for: HGBA1C    ASSESSMENT & PLAN:   Problem List Items Addressed This Visit      Endocrine   Hypothyroidism due to Hashimoto's thyroiditis    3 months ago thyroid level was .02, patient is euthyroid and has attempted to come down to 50 mcg of Levothyroxine in the past but did not tolerate well so she is back up to 100 mcg. Plan- I am checking TSH today and she is backing her Levo down to 75 mcg.      Relevant Orders   TSH     Nervous and Auditory   Benign paroxysmal positional vertigo    Patient had an episode of Dizziness yesterday morning after waking from sleep. She made her way to the restroom but had to be helped back to bed. She does not recall rapid heart rate, no weakness or slurred speech recalled. She did  have flushing  after the episode that lasted about 1 hour but she remained somewhat dizzy for the rest of the day.         Other   Pure hypercholesterolemia    Last lipid panel was not fasting will check fasting lipid panel today.       Relevant Orders   Lipid Profile   Dizziness - Primary    Patient had an episode of Dizziness yesterday morning after waking from sleep. She made her way to the restroom but had to be helped back to bed. She does not recall rapid heart rate, no weakness or slurred speech recalled. She did have flushing after the episode that lasted about 1 hour but she remained somewhat dizzy for the rest of the day. Plan- ECG wnl today, patient to buy OTC meclizine in case sx return.       Relevant Orders   EKG 12-Lead      No orders of the defined types were placed in this encounter.     Follow-up: No follow-ups on file.    Beckie Salts, [place] [address] [place], [place] [address]

## 2020-04-05 NOTE — Assessment & Plan Note (Signed)
Last lipid panel was not fasting will check fasting lipid panel today.

## 2020-04-06 LAB — LIPID PANEL
Cholesterol: 239 mg/dL — ABNORMAL HIGH (ref <200)
HDL: 62 mg/dL (ref >=50)
LDL Cholesterol (Calc): 158 mg/dL (calc) — ABNORMAL HIGH
Non-HDL Cholesterol (Calc): 177 mg/dL (calc) — ABNORMAL HIGH (ref <130)
Total CHOL/HDL Ratio: 3.9 (calc) (ref <5.0)
Triglycerides: 82 mg/dL (ref <150)

## 2020-04-06 LAB — TSH: TSH: 0.02 mIU/L — ABNORMAL LOW (ref 0.40–4.50)

## 2020-04-06 LAB — EXTRA LAV TOP TUBE

## 2020-04-12 ENCOUNTER — Other Ambulatory Visit: Payer: Self-pay | Admitting: Family Medicine

## 2020-04-12 MED ORDER — ROSUVASTATIN CALCIUM 10 MG PO TABS: 10.0000 mg | ORAL_TABLET | Freq: Every day | ORAL | 3 refills | Status: DC

## 2020-05-06 ENCOUNTER — Other Ambulatory Visit: Payer: Self-pay | Admitting: Family Medicine

## 2020-07-03 ENCOUNTER — Other Ambulatory Visit: Payer: Self-pay

## 2020-07-03 ENCOUNTER — Ambulatory Visit (INDEPENDENT_AMBULATORY_CARE_PROVIDER_SITE_OTHER): Payer: Medicare HMO | Admitting: Internal Medicine

## 2020-07-03 ENCOUNTER — Encounter: Payer: Self-pay | Admitting: Internal Medicine

## 2020-07-03 VITALS — BP 132/78 | HR 68 | Ht 63.0 in | Wt 122.1 lb

## 2020-07-03 DIAGNOSIS — Z6820 Body mass index (BMI) 20.0-20.9, adult: Secondary | ICD-10-CM | POA: Diagnosis not present

## 2020-07-03 DIAGNOSIS — E7800 Pure hypercholesterolemia, unspecified: Secondary | ICD-10-CM

## 2020-07-03 DIAGNOSIS — E038 Other specified hypothyroidism: Secondary | ICD-10-CM

## 2020-07-03 DIAGNOSIS — I10 Essential (primary) hypertension: Secondary | ICD-10-CM

## 2020-07-03 DIAGNOSIS — E063 Autoimmune thyroiditis: Secondary | ICD-10-CM

## 2020-07-03 NOTE — Assessment & Plan Note (Signed)
Patient BMI is stable at present time.

## 2020-07-03 NOTE — Progress Notes (Signed)
Established Patient Office Visit  Subjective:  Patient ID: Suzanne Ritter, female    DOB: 1950-06-12  Age: 70 y.o. MRN: [id number]  CC:  Chief Complaint  Patient presents with  . Follow-up    Pt here for 3 month follow up     HPI  Suzanne Ritter presents for check up  Past Medical History:  Diagnosis Date  . Hypertension   . Thyroid disease     Past Surgical History:  Procedure Laterality Date  . BRAIN SURGERY  2007  . CERVICAL CONE BIOPSY    . CESAREAN SECTION    . COLONOSCOPY WITH PROPOFOL N/A 10/26/2016   Procedure: COLONOSCOPY WITH PROPOFOL;  Surgeon: Lollie Sails, MD;  Location: [facility] ENDOSCOPY;  Service: Endoscopy;  Laterality: N/A;  . THYROID SURGERY      Family History  Problem Relation Age of Onset  . Cancer Mother        kidney    Social History   Socioeconomic History  . Marital status: Married    Spouse name: Not on file  . Number of children: Not on file  . Years of education: Not on file  . Highest education level: Not on file  Occupational History  . Not on file  Tobacco Use  . Smoking status: Former Research scientist (life sciences)  . Smokeless tobacco: Never Used  Substance and Sexual Activity  . Alcohol use: No  . Drug use: No  . Sexual activity: Not on file  Other Topics Concern  . Not on file  Social History Narrative  . Not on file   Social Determinants of Health   Financial Resource Strain: Not on file  Food Insecurity: Not on file  Transportation Needs: Not on file  Physical Activity: Not on file  Stress: Not on file  Social Connections: Not on file  Intimate Partner Violence: Not on file     Current Outpatient Medications:  .  hydrOXYzine (ATARAX/VISTARIL) 25 MG tablet, Take 1 tablet (25 mg total) by mouth 3 (three) times daily as needed for anxiety., Disp: 20 tablet, Rfl: 0 .  levothyroxine (SYNTHROID) 100 MCG tablet, TAKE 1 TABLET(100 MCG) BY MOUTH EVERY DAY, Disp: 90 tablet, Rfl: 0 .  lisinopril-hydrochlorothiazide  (ZESTORETIC) 10-12.5 MG tablet, TAKE 1 TABLET BY MOUTH DAILY, Disp: 30 tablet, Rfl: 6 .  rosuvastatin (CRESTOR) 10 MG tablet, Take 1 tablet (10 mg total) by mouth daily., Disp: 90 tablet, Rfl: 3   Allergies  Allergen Reactions  . Naproxen Other (See Comments)    Other Reaction: GI Upset    ROS Review of Systems  Constitutional: Negative.   HENT: Negative.   Eyes: Negative.   Respiratory: Negative.   Cardiovascular: Negative.   Gastrointestinal: Negative.   Endocrine: Negative.   Genitourinary: Negative.   Musculoskeletal: Negative.   Skin: Negative.   Allergic/Immunologic: Negative.   Neurological: Negative.   Hematological: Negative.   Psychiatric/Behavioral: Negative.   All other systems reviewed and are negative.     Objective:    Physical Exam Vitals reviewed.  Constitutional:      Appearance: Normal appearance.  HENT:     Mouth/Throat:     Mouth: Mucous membranes are moist.  Eyes:     Pupils: Pupils are equal, round, and reactive to light.  Neck:     Vascular: No carotid bruit.  Cardiovascular:     Rate and Rhythm: Normal rate and regular rhythm.     Pulses: Normal pulses.     Heart sounds: Normal heart  sounds.  Pulmonary:     Effort: Pulmonary effort is normal.     Breath sounds: Normal breath sounds.  Abdominal:     General: Bowel sounds are normal.     Palpations: Abdomen is soft. There is no hepatomegaly, splenomegaly or mass.     Tenderness: There is no abdominal tenderness.     Hernia: No hernia is present.  Musculoskeletal:        General: No tenderness.     Cervical back: Neck supple.     Right lower leg: No edema.     Left lower leg: No edema.  Skin:    Findings: No rash.  Neurological:     Mental Status: She is alert and oriented to person, place, and time.     Motor: No weakness.  Psychiatric:        Mood and Affect: Mood and affect normal.        Behavior: Behavior normal.     BP 132/78   Pulse 68   Ht 5\' 3"  (1.6 m)   Wt 122 lb  1.6 oz (55.4 kg)   BMI 21.63 kg/m  Wt Readings from Last 3 Encounters:  07/03/20 122 lb 1.6 oz (55.4 kg)  04/05/20 117 lb 4.8 oz (53.2 kg)  01/05/20 119 lb (54 kg)     Health Maintenance Due  Topic Date Due  . Hepatitis C Screening  Never done  . TETANUS/TDAP  Never done  . MAMMOGRAM  Never done  . DEXA SCAN  Never done  . PNA vac Low Risk Adult (1 of 2 - PCV13) Never done    There are no preventive care reminders to display for this patient.  Lab Results  Component Value Date   TSH 0.02 (L) 04/05/2020   Lab Results  Component Value Date   WBC 4.8 12/26/2019   HGB 14.5 12/26/2019   HCT 43.8 12/26/2019   MCV 89.8 12/26/2019   PLT 275 12/26/2019   Lab Results  Component Value Date   NA 139 12/26/2019   K 4.5 12/26/2019   CO2 27 12/26/2019   GLUCOSE 76 12/26/2019   BUN 11 12/26/2019   CREATININE 0.94 (H) 12/26/2019   BILITOT 0.6 12/26/2019   ALKPHOS 63 05/24/2018   AST 18 12/26/2019   ALT 11 12/26/2019   PROT 6.8 12/26/2019   ALBUMIN 4.6 05/24/2018   CALCIUM 9.4 12/26/2019   ANIONGAP 15 05/24/2018   Lab Results  Component Value Date   CHOL 239 (H) 04/05/2020   Lab Results  Component Value Date   HDL 62 04/05/2020   Lab Results  Component Value Date   LDLCALC 158 (H) 04/05/2020   Lab Results  Component Value Date   TRIG 82 04/05/2020   Lab Results  Component Value Date   CHOLHDL 3.9 04/05/2020   No results found for: HGBA1C    Assessment & Plan:   Problem List Items Addressed This Visit      Cardiovascular and Mediastinum   Essential hypertension - Primary    Patient blood pressure is normal patient denies any chest pain or shortness of breath there is no history of palpitation paroxysmal nocturnal dyspnea patient can walk  25yards without any problem patient was advised to follow low-salt low-cholesterol diet  I reviewed the results of Sprint trial  ideally I want to keep systolic blood pressure below 130 mmHg, patient was asked to check  blood pressure 3 times a week and give me a report on that.  Patient  will be follow-up in 3 months, patient will call me back for any change in the cardiovascular symptoms           Endocrine   Hypothyroidism due to Hashimoto's thyroiditis    We will check T3-T4 and TSH.        Other   BMI 20.0-20.9, adult    Patient BMI is stable at present time.      Pure hypercholesterolemia    Hypercholesterolemia  I advised the patient to follow Mediterranean diet This diet is rich in fruits vegetables and whole grain, and This diet is also rich in fish and lean meat Patient should also eat a handful of almonds or walnuts daily Recent heart study indicated that average follow-up on this kind of diet reduces the cardiovascular mortality by 50 to 70%==         No orders of the defined types were placed in this encounter.   Follow-up: No follow-ups on file.    Cletis Athens, MD

## 2020-07-03 NOTE — Assessment & Plan Note (Signed)
We will check T3-T4 and TSH.

## 2020-07-03 NOTE — Assessment & Plan Note (Signed)

## 2020-07-03 NOTE — Assessment & Plan Note (Signed)
Hypercholesterolemia  I advised the patient to follow Mediterranean diet This diet is rich in fruits vegetables and whole grain, and This diet is also rich in fish and lean meat Patient should also eat a handful of almonds or walnuts daily Recent heart study indicated that average follow-up on this kind of diet reduces the cardiovascular mortality by 50 to 70%== 

## 2020-07-09 ENCOUNTER — Ambulatory Visit: Payer: Medicare HMO | Admitting: Internal Medicine

## 2020-08-08 ENCOUNTER — Other Ambulatory Visit: Payer: Self-pay | Admitting: Internal Medicine

## 2020-09-16 ENCOUNTER — Other Ambulatory Visit (INDEPENDENT_AMBULATORY_CARE_PROVIDER_SITE_OTHER): Payer: Medicare HMO

## 2020-09-16 ENCOUNTER — Other Ambulatory Visit: Payer: Self-pay

## 2020-09-16 DIAGNOSIS — E038 Other specified hypothyroidism: Secondary | ICD-10-CM | POA: Diagnosis not present

## 2020-09-16 DIAGNOSIS — E063 Autoimmune thyroiditis: Secondary | ICD-10-CM | POA: Diagnosis not present

## 2020-09-17 ENCOUNTER — Ambulatory Visit (INDEPENDENT_AMBULATORY_CARE_PROVIDER_SITE_OTHER): Payer: Medicare HMO | Admitting: Internal Medicine

## 2020-09-17 ENCOUNTER — Encounter: Payer: Self-pay | Admitting: Internal Medicine

## 2020-09-17 VITALS — BP 130/80 | HR 62 | Ht 63.0 in | Wt 122.1 lb

## 2020-09-17 DIAGNOSIS — H8110 Benign paroxysmal vertigo, unspecified ear: Secondary | ICD-10-CM

## 2020-09-17 DIAGNOSIS — I10 Essential (primary) hypertension: Secondary | ICD-10-CM

## 2020-09-17 DIAGNOSIS — E7800 Pure hypercholesterolemia, unspecified: Secondary | ICD-10-CM

## 2020-09-17 DIAGNOSIS — E038 Other specified hypothyroidism: Secondary | ICD-10-CM | POA: Diagnosis not present

## 2020-09-17 DIAGNOSIS — E063 Autoimmune thyroiditis: Secondary | ICD-10-CM | POA: Diagnosis not present

## 2020-09-17 LAB — TSH: TSH: 0.02 mIU/L — ABNORMAL LOW (ref 0.40–4.50)

## 2020-09-17 LAB — T3: T3, Total: 96 ng/dL (ref 76–181)

## 2020-09-17 LAB — T4: T4, Total: 13.4 ug/dL — ABNORMAL HIGH (ref 5.1–11.9)

## 2020-09-17 MED ORDER — LISINOPRIL-HYDROCHLOROTHIAZIDE 10-12.5 MG PO TABS: 1.0000 | ORAL_TABLET | Freq: Every day | ORAL | 6 refills | Status: DC

## 2020-09-17 NOTE — Assessment & Plan Note (Signed)
Continue present medication.  Patient states she is more comfortable using 100 mcg of levothyroxine every day does not want to reduce it to 50.  I told her to take vitamin D and calcium every day.

## 2020-09-17 NOTE — Assessment & Plan Note (Signed)
Resolved

## 2020-09-17 NOTE — Assessment & Plan Note (Signed)
Hypercholesterolemia  I advised the patient to follow Mediterranean diet This diet is rich in fruits vegetables and whole grain, and This diet is also rich in fish and lean meat Patient should also eat a handful of almonds or walnuts daily Recent heart study indicated that average follow-up on this kind of diet reduces the cardiovascular mortality by 50 to 70%== 

## 2020-09-17 NOTE — Assessment & Plan Note (Signed)

## 2020-09-17 NOTE — Progress Notes (Signed)
Established Patient Office Visit  Subjective:  Patient ID: Suzanne Ritter, female    DOB: [birth year]  Age: 70 y.o. MRN: [id number]  CC:  Chief Complaint  Patient presents with   lab results    HPI  Suzanne Ritter presents for blood pressure check.  She has a hypothyroidism and elevated cholesterol he takes his medicine regularly.  She denies any chest pain or shortness of breath.   She was does not smoke does not drink.  Her vertigo is no longer present.  She has received her COVID-vaccine.  She follows low-cholesterol diet.  Her blood pressure medicine has been refilled.  Past Medical History:  Diagnosis Date   Hypertension    Thyroid disease     Past Surgical History:  Procedure Laterality Date   BRAIN SURGERY  2007   CERVICAL CONE BIOPSY     CESAREAN SECTION     COLONOSCOPY WITH PROPOFOL N/A 10/26/2016   Procedure: COLONOSCOPY WITH PROPOFOL;  Surgeon: Lollie Sails, MD;  Location: [facility] ENDOSCOPY;  Service: Endoscopy;  Laterality: N/A;   THYROID SURGERY      Family History  Problem Relation Age of Onset   Cancer Mother        kidney    Social History   Socioeconomic History   Marital status: Married    Spouse name: Not on file   Number of children: Not on file   Years of education: Not on file   Highest education level: Not on file  Occupational History   Not on file  Tobacco Use   Smoking status: Former    Pack years: 0.00   Smokeless tobacco: Never  Substance and Sexual Activity   Alcohol use: No   Drug use: No   Sexual activity: Not on file  Other Topics Concern   Not on file  Social History Narrative   Not on file   Social Determinants of Health   Financial Resource Strain: Not on file  Food Insecurity: Not on file  Transportation Needs: Not on file  Physical Activity: Not on file  Stress: Not on file  Social Connections: Not on file  Intimate Partner Violence: Not on file     Current Outpatient Medications:     hydrOXYzine (ATARAX/VISTARIL) 25 MG tablet, Take 1 tablet (25 mg total) by mouth 3 (three) times daily as needed for anxiety., Disp: 20 tablet, Rfl: 0   levothyroxine (SYNTHROID) 100 MCG tablet, TAKE 1 TABLET(100 MCG) BY MOUTH EVERY DAY, Disp: 90 tablet, Rfl: 0   rosuvastatin (CRESTOR) 10 MG tablet, Take 1 tablet (10 mg total) by mouth daily., Disp: 90 tablet, Rfl: 3   lisinopril-hydrochlorothiazide (ZESTORETIC) 10-12.5 MG tablet, Take 1 tablet by mouth daily., Disp: 30 tablet, Rfl: 6   Allergies  Allergen Reactions   Naproxen Other (See Comments)    Other Reaction: GI Upset    ROS Review of Systems  Constitutional: Negative.   HENT: Negative.    Eyes: Negative.   Respiratory: Negative.    Cardiovascular: Negative.   Gastrointestinal: Negative.   Endocrine: Negative.   Genitourinary: Negative.   Musculoskeletal: Negative.   Skin: Negative.   Allergic/Immunologic: Negative.   Neurological: Negative.   Hematological: Negative.   Psychiatric/Behavioral: Negative.    All other systems reviewed and are negative.    Objective:    Physical Exam Vitals reviewed.  Constitutional:      Appearance: Normal appearance.  HENT:     Mouth/Throat:     Mouth: Mucous membranes  are moist.  Eyes:     Pupils: Pupils are equal, round, and reactive to light.  Neck:     Vascular: No carotid bruit.  Cardiovascular:     Rate and Rhythm: Normal rate and regular rhythm.     Pulses: Normal pulses.     Heart sounds: Normal heart sounds.  Pulmonary:     Effort: Pulmonary effort is normal.     Breath sounds: Normal breath sounds.  Abdominal:     General: Bowel sounds are normal.     Palpations: Abdomen is soft. There is no hepatomegaly, splenomegaly or mass.     Tenderness: There is no abdominal tenderness.     Hernia: No hernia is present.  Musculoskeletal:        General: No tenderness.     Cervical back: Neck supple.     Right lower leg: No edema.     Left lower leg: No edema.  Skin:     Findings: No rash.  Neurological:     Mental Status: She is alert and oriented to person, place, and time.     Motor: No weakness.  Psychiatric:        Mood and Affect: Mood and affect normal.        Behavior: Behavior normal.    BP (!) 148/81   Pulse 62   Ht 5\' 3"  (1.6 m)   Wt 122 lb 1.6 oz (55.4 kg)   BMI 21.63 kg/m  Wt Readings from Last 3 Encounters:  09/17/20 122 lb 1.6 oz (55.4 kg)  07/03/20 122 lb 1.6 oz (55.4 kg)  04/05/20 117 lb 4.8 oz (53.2 kg)     Health Maintenance Due  Topic Date Due   Hepatitis C Screening  Never done   TETANUS/TDAP  Never done   MAMMOGRAM  Never done   Zoster Vaccines- Shingrix (1 of 2) Never done   DEXA SCAN  Never done   PNA vac Low Risk Adult (1 of 2 - PCV13) Never done   COVID-19 Vaccine (3 - Booster for Pfizer series) 06/29/2020    There are no preventive care reminders to display for this patient.  Lab Results  Component Value Date   TSH 0.02 (L) 09/16/2020   Lab Results  Component Value Date   WBC 4.8 12/26/2019   HGB 14.5 12/26/2019   HCT 43.8 12/26/2019   MCV 89.8 12/26/2019   PLT 275 12/26/2019   Lab Results  Component Value Date   NA 139 12/26/2019   K 4.5 12/26/2019   CO2 27 12/26/2019   GLUCOSE 76 12/26/2019   BUN 11 12/26/2019   CREATININE 0.94 (H) 12/26/2019   BILITOT 0.6 12/26/2019   ALKPHOS 63 05/24/2018   AST 18 12/26/2019   ALT 11 12/26/2019   PROT 6.8 12/26/2019   ALBUMIN 4.6 05/24/2018   CALCIUM 9.4 12/26/2019   ANIONGAP 15 05/24/2018   Lab Results  Component Value Date   CHOL 239 (H) 04/05/2020   Lab Results  Component Value Date   HDL 62 04/05/2020   Lab Results  Component Value Date   LDLCALC 158 (H) 04/05/2020   Lab Results  Component Value Date   TRIG 82 04/05/2020   Lab Results  Component Value Date   CHOLHDL 3.9 04/05/2020   No results found for: HGBA1C    Assessment & Plan:   Problem List Items Addressed This Visit       Cardiovascular and Mediastinum   Essential  hypertension - Primary  Patient blood pressure is normal patient denies any chest pain or shortness of breath there is no history of palpitation or paroxysmal nocturnal dyspnea   patient was advised to follow low-salt low-cholesterol diet    ideally I want to keep systolic blood pressure below 130 mmHg, patient was asked to check blood pressure one times a week and give me a report on that.  Patient will be follow-up in 3 months  or earlier as needed, patient will call me back for any change in the cardiovascular symptoms Patient was advised to buy a book from local bookstore concerning blood pressure and read several chapters  every day.  This will be supplemented by some of the material we will give him from the office.  Patient should also utilize other resources like YouTube and Internet to learn more about the blood pressure and the diet.       Relevant Medications   lisinopril-hydrochlorothiazide (ZESTORETIC) 10-12.5 MG tablet     Endocrine   Hypothyroidism due to Hashimoto's thyroiditis    Continue present medication.  Patient states she is more comfortable using 100 mcg of levothyroxine every day does not want to reduce it to 50.  I told her to take vitamin D and calcium every day.         Nervous and Auditory   Benign paroxysmal positional vertigo    Resolved         Other   Pure hypercholesterolemia    Hypercholesterolemia  I advised the patient to follow Mediterranean diet This diet is rich in fruits vegetables and whole grain, and This diet is also rich in fish and lean meat Patient should also eat a handful of almonds or walnuts daily Recent heart study indicated that average follow-up on this kind of diet reduces the cardiovascular mortality by 50 to 70%==       Relevant Medications   lisinopril-hydrochlorothiazide (ZESTORETIC) 10-12.5 MG tablet    Meds ordered this encounter  Medications   lisinopril-hydrochlorothiazide (ZESTORETIC) 10-12.5 MG tablet     Sig: Take 1 tablet by mouth daily.    Dispense:  30 tablet    Refill:  6    Follow-up: No follow-ups on file.    Cletis Athens, MD

## 2020-11-07 ENCOUNTER — Other Ambulatory Visit: Payer: Self-pay | Admitting: *Deleted

## 2020-11-22 ENCOUNTER — Other Ambulatory Visit: Payer: Self-pay | Admitting: Internal Medicine

## 2020-12-16 ENCOUNTER — Encounter: Payer: Self-pay | Admitting: Internal Medicine

## 2020-12-16 ENCOUNTER — Ambulatory Visit (INDEPENDENT_AMBULATORY_CARE_PROVIDER_SITE_OTHER): Payer: Medicare HMO | Admitting: Internal Medicine

## 2020-12-16 ENCOUNTER — Other Ambulatory Visit: Payer: Self-pay

## 2020-12-16 VITALS — BP 132/82 | HR 73 | Ht 63.0 in | Wt 118.3 lb

## 2020-12-16 DIAGNOSIS — E063 Autoimmune thyroiditis: Secondary | ICD-10-CM | POA: Diagnosis not present

## 2020-12-16 DIAGNOSIS — E890 Postprocedural hypothyroidism: Secondary | ICD-10-CM | POA: Diagnosis not present

## 2020-12-16 DIAGNOSIS — H8110 Benign paroxysmal vertigo, unspecified ear: Secondary | ICD-10-CM | POA: Diagnosis not present

## 2020-12-16 DIAGNOSIS — E7800 Pure hypercholesterolemia, unspecified: Secondary | ICD-10-CM

## 2020-12-16 DIAGNOSIS — I10 Essential (primary) hypertension: Secondary | ICD-10-CM

## 2020-12-16 DIAGNOSIS — Z7185 Encounter for immunization safety counseling: Secondary | ICD-10-CM | POA: Diagnosis not present

## 2020-12-16 DIAGNOSIS — E038 Other specified hypothyroidism: Secondary | ICD-10-CM | POA: Diagnosis not present

## 2020-12-16 MED ORDER — LOSARTAN POTASSIUM 50 MG PO TABS: 50.0000 mg | ORAL_TABLET | Freq: Every day | ORAL | 6 refills | Status: DC

## 2020-12-16 MED ORDER — LEVOTHYROXINE SODIUM 88 MCG PO TABS: 88.0000 ug | ORAL_TABLET | Freq: Every day | ORAL | 3 refills | Status: DC

## 2020-12-16 NOTE — Assessment & Plan Note (Signed)
Hypercholesterolemia  I advised the patient to follow Mediterranean diet This diet is rich in fruits vegetables and whole grain, and This diet is also rich in fish and lean meat Patient should also eat a handful of almonds or walnuts daily Recent heart study indicated that average follow-up on this kind of diet reduces the cardiovascular mortality by 50 to 70%== 

## 2020-12-16 NOTE — Assessment & Plan Note (Signed)

## 2020-12-16 NOTE — Assessment & Plan Note (Signed)
Resolved

## 2020-12-16 NOTE — Progress Notes (Signed)
Established Patient Office Visit  Subjective:  Patient ID: Suzanne Ritter, female    DOB: 1950-08-21  Age: 70 y.o. MRN: [id number]  CC:  Chief Complaint  Patient presents with   Follow-up    3 month follow up. Patient would like to discuss a change in thyroid medication.    HPI  Suzanne Ritter presents for med change, she has a heartburn after taking lisinopril which was discontinued.  Started on losartan.,  Her dose of Levoxyl was reduced because of blood test  Past Medical History:  Diagnosis Date   Hypertension    Thyroid disease     Past Surgical History:  Procedure Laterality Date   BRAIN SURGERY  2007   CERVICAL CONE BIOPSY     CESAREAN SECTION     COLONOSCOPY WITH PROPOFOL N/A 10/26/2016   Procedure: COLONOSCOPY WITH PROPOFOL;  Surgeon: Lollie Sails, MD;  Location: [facility] ENDOSCOPY;  Service: Endoscopy;  Laterality: N/A;   THYROID SURGERY      Family History  Problem Relation Age of Onset   Cancer Mother        kidney    Social History   Socioeconomic History   Marital status: Married    Spouse name: Not on file   Number of children: Not on file   Years of education: Not on file   Highest education level: Not on file  Occupational History   Not on file  Tobacco Use   Smoking status: Former   Smokeless tobacco: Never  Substance and Sexual Activity   Alcohol use: No   Drug use: No   Sexual activity: Not on file  Other Topics Concern   Not on file  Social History Narrative   Not on file   Social Determinants of Health   Financial Resource Strain: Not on file  Food Insecurity: Not on file  Transportation Needs: Not on file  Physical Activity: Not on file  Stress: Not on file  Social Connections: Not on file  Intimate Partner Violence: Not on file     Current Outpatient Medications:    levothyroxine (SYNTHROID) 88 MCG tablet, Take 1 tablet (88 mcg total) by mouth daily., Disp: 90 tablet, Rfl: 3   losartan (COZAAR) 50 MG  tablet, Take 1 tablet (50 mg total) by mouth daily., Disp: 30 tablet, Rfl: 6   hydrOXYzine (ATARAX/VISTARIL) 25 MG tablet, Take 1 tablet (25 mg total) by mouth 3 (three) times daily as needed for anxiety., Disp: 20 tablet, Rfl: 0   rosuvastatin (CRESTOR) 10 MG tablet, Take 1 tablet (10 mg total) by mouth daily., Disp: 90 tablet, Rfl: 3   Allergies  Allergen Reactions   Naproxen Other (See Comments)    Other Reaction: GI Upset    ROS Review of Systems  Constitutional: Negative.   HENT: Negative.    Eyes: Negative.   Respiratory: Negative.    Cardiovascular: Negative.   Gastrointestinal: Negative.   Endocrine: Negative.   Genitourinary: Negative.   Musculoskeletal: Negative.   Skin: Negative.   Allergic/Immunologic: Negative.   Neurological: Negative.   Hematological: Negative.   Psychiatric/Behavioral: Negative.    All other systems reviewed and are negative.    Objective:    Physical Exam Vitals reviewed.  Constitutional:      Appearance: Normal appearance.  HENT:     Mouth/Throat:     Mouth: Mucous membranes are moist.  Eyes:     Pupils: Pupils are equal, round, and reactive to light.  Neck:  Vascular: No carotid bruit.  Cardiovascular:     Rate and Rhythm: Normal rate and regular rhythm.     Pulses: Normal pulses.     Heart sounds: Normal heart sounds.  Pulmonary:     Effort: Pulmonary effort is normal.     Breath sounds: Normal breath sounds.  Abdominal:     General: Bowel sounds are normal.     Palpations: Abdomen is soft. There is no hepatomegaly, splenomegaly or mass.     Tenderness: There is no abdominal tenderness.     Hernia: No hernia is present.  Musculoskeletal:        General: No tenderness.     Cervical back: Neck supple.     Right lower leg: No edema.     Left lower leg: No edema.  Skin:    Findings: No rash.  Neurological:     Mental Status: She is alert and oriented to person, place, and time.     Motor: No weakness.  Psychiatric:         Mood and Affect: Mood and affect normal.        Behavior: Behavior normal.    BP 132/82   Pulse 73   Ht '5\' 3"'$  (1.6 m)   Wt 118 lb 4.8 oz (53.7 kg)   BMI 20.96 kg/m  Wt Readings from Last 3 Encounters:  12/16/20 118 lb 4.8 oz (53.7 kg)  09/17/20 122 lb 1.6 oz (55.4 kg)  07/03/20 122 lb 1.6 oz (55.4 kg)     Health Maintenance Due  Topic Date Due   Hepatitis C Screening  Never done   TETANUS/TDAP  Never done   MAMMOGRAM  Never done   Zoster Vaccines- Shingrix (1 of 2) Never done   DEXA SCAN  Never done   COVID-19 Vaccine (3 - Booster for Pfizer series) 06/29/2020   INFLUENZA VACCINE  Never done    There are no preventive care reminders to display for this patient.  Lab Results  Component Value Date   TSH 0.02 (L) 09/16/2020   Lab Results  Component Value Date   WBC 4.8 12/26/2019   HGB 14.5 12/26/2019   HCT 43.8 12/26/2019   MCV 89.8 12/26/2019   PLT 275 12/26/2019   Lab Results  Component Value Date   NA 139 12/26/2019   K 4.5 12/26/2019   CO2 27 12/26/2019   GLUCOSE 76 12/26/2019   BUN 11 12/26/2019   CREATININE 0.94 (H) 12/26/2019   BILITOT 0.6 12/26/2019   ALKPHOS 63 05/24/2018   AST 18 12/26/2019   ALT 11 12/26/2019   PROT 6.8 12/26/2019   ALBUMIN 4.6 05/24/2018   CALCIUM 9.4 12/26/2019   ANIONGAP 15 05/24/2018   Lab Results  Component Value Date   CHOL 239 (H) 04/05/2020   Lab Results  Component Value Date   HDL 62 04/05/2020   Lab Results  Component Value Date   LDLCALC 158 (H) 04/05/2020   Lab Results  Component Value Date   TRIG 82 04/05/2020   Lab Results  Component Value Date   CHOLHDL 3.9 04/05/2020   No results found for: HGBA1C    Assessment & Plan:   Problem List Items Addressed This Visit       Cardiovascular and Mediastinum   Essential hypertension - Primary     Patient denies any chest pain or shortness of breath there is no history of palpitation or paroxysmal nocturnal dyspnea   patient was advised to  follow low-salt low-cholesterol diet  ideally I want to keep systolic blood pressure below 130 mmHg, patient was asked to check blood pressure one times a week and give me a report on that.  Patient will be follow-up in 3 months  or earlier as needed, patient will call me back for any change in the cardiovascular symptoms Patient was advised to buy a book from local bookstore concerning blood pressure and read several chapters  every day.  This will be supplemented by some of the material we will give him from the office.  Patient should also utilize other resources like YouTube and Internet to learn more about the blood pressure and the diet.      Relevant Medications   losartan (COZAAR) 50 MG tablet     Endocrine   Hypothyroidism due to Hashimoto's thyroiditis    Dose of Levoxyl was reduced to 88 mcg p.o. daily      Relevant Medications   levothyroxine (SYNTHROID) 88 MCG tablet     Nervous and Auditory   Benign paroxysmal positional vertigo    Resolved        Other   Vaccine counseling   Pure hypercholesterolemia    Hypercholesterolemia  I advised the patient to follow Mediterranean diet This diet is rich in fruits vegetables and whole grain, and This diet is also rich in fish and lean meat Patient should also eat a handful of almonds or walnuts daily Recent heart study indicated that average follow-up on this kind of diet reduces the cardiovascular mortality by 50 to 70%==      Relevant Medications   losartan (COZAAR) 50 MG tablet   Other Visit Diagnoses     Postoperative hypothyroidism       Relevant Medications   levothyroxine (SYNTHROID) 88 MCG tablet       Meds ordered this encounter  Medications   levothyroxine (SYNTHROID) 88 MCG tablet    Sig: Take 1 tablet (88 mcg total) by mouth daily.    Dispense:  90 tablet    Refill:  3   losartan (COZAAR) 50 MG tablet    Sig: Take 1 tablet (50 mg total) by mouth daily.    Dispense:  30 tablet    Refill:  6     Follow-up: No follow-ups on file.    Cletis Athens, MD

## 2020-12-16 NOTE — Assessment & Plan Note (Signed)
Dose of Levoxyl was reduced to 88 mcg p.o. daily

## 2021-02-28 ENCOUNTER — Other Ambulatory Visit: Payer: Self-pay | Admitting: Internal Medicine

## 2021-03-05 ENCOUNTER — Ambulatory Visit (INDEPENDENT_AMBULATORY_CARE_PROVIDER_SITE_OTHER): Payer: Medicare HMO | Admitting: Internal Medicine

## 2021-03-05 ENCOUNTER — Other Ambulatory Visit: Payer: Self-pay

## 2021-03-05 ENCOUNTER — Encounter: Payer: Self-pay | Admitting: Internal Medicine

## 2021-03-05 VITALS — BP 159/96 | HR 78 | Ht 63.0 in | Wt 120.8 lb

## 2021-03-05 DIAGNOSIS — Z7185 Encounter for immunization safety counseling: Secondary | ICD-10-CM | POA: Diagnosis not present

## 2021-03-05 DIAGNOSIS — R000 Tachycardia, unspecified: Secondary | ICD-10-CM

## 2021-03-05 DIAGNOSIS — E7800 Pure hypercholesterolemia, unspecified: Secondary | ICD-10-CM | POA: Diagnosis not present

## 2021-03-05 DIAGNOSIS — R42 Dizziness and giddiness: Secondary | ICD-10-CM | POA: Diagnosis not present

## 2021-03-05 DIAGNOSIS — E038 Other specified hypothyroidism: Secondary | ICD-10-CM

## 2021-03-05 DIAGNOSIS — H8110 Benign paroxysmal vertigo, unspecified ear: Secondary | ICD-10-CM

## 2021-03-05 DIAGNOSIS — E063 Autoimmune thyroiditis: Secondary | ICD-10-CM

## 2021-03-05 DIAGNOSIS — I10 Essential (primary) hypertension: Secondary | ICD-10-CM

## 2021-03-05 NOTE — Assessment & Plan Note (Signed)
Patient is not willing to take COVID shot and flu shot

## 2021-03-05 NOTE — Assessment & Plan Note (Signed)
Check thyroid function test again

## 2021-03-05 NOTE — Assessment & Plan Note (Signed)

## 2021-03-05 NOTE — Assessment & Plan Note (Signed)
Stable now. 

## 2021-03-05 NOTE — Assessment & Plan Note (Signed)
Electrocardiogram does not show any acute changes. 

## 2021-03-05 NOTE — Progress Notes (Signed)
Established Patient Office Visit  Subjective:  Patient ID: Suzanne Ritter, female    DOB: 10-Feb-1951  Age: 70 y.o. MRN: [id number]  CC:  Chief Complaint  Patient presents with   Palpitations    Was having palpitations last night, patient felt heart was racing extremely fast. Patient had chills and headache with it. Patient has not had any problems today.     Palpitations  The current episode started in the past 7 days. The problem has been resolved. The symptoms are aggravated by thyroid drugs.   Suzanne Ritter presents for bp check, c/o tachy cardia  Past Medical History:  Diagnosis Date   Hypertension    Thyroid disease     Past Surgical History:  Procedure Laterality Date   BRAIN SURGERY  2007   CERVICAL CONE BIOPSY     CESAREAN SECTION     COLONOSCOPY WITH PROPOFOL N/A 10/26/2016   Procedure: COLONOSCOPY WITH PROPOFOL;  Surgeon: Lollie Sails, MD;  Location: [facility] ENDOSCOPY;  Service: Endoscopy;  Laterality: N/A;   THYROID SURGERY      Family History  Problem Relation Age of Onset   Cancer Mother        kidney    Social History   Socioeconomic History   Marital status: Married    Spouse name: Not on file   Number of children: Not on file   Years of education: Not on file   Highest education level: Not on file  Occupational History   Not on file  Tobacco Use   Smoking status: Former   Smokeless tobacco: Never  Substance and Sexual Activity   Alcohol use: No   Drug use: No   Sexual activity: Not on file  Other Topics Concern   Not on file  Social History Narrative   Not on file   Social Determinants of Health   Financial Resource Strain: Not on file  Food Insecurity: Not on file  Transportation Needs: Not on file  Physical Activity: Not on file  Stress: Not on file  Social Connections: Not on file  Intimate Partner Violence: Not on file     Current Outpatient Medications:    levothyroxine (SYNTHROID) 88 MCG tablet, Take  1 tablet (88 mcg total) by mouth daily., Disp: 90 tablet, Rfl: 3   losartan (COZAAR) 50 MG tablet, Take 1 tablet (50 mg total) by mouth daily., Disp: 30 tablet, Rfl: 6   rosuvastatin (CRESTOR) 10 MG tablet, Take 1 tablet (10 mg total) by mouth daily., Disp: 90 tablet, Rfl: 3   Allergies  Allergen Reactions   Naproxen Other (See Comments)    Other Reaction: GI Upset    ROS Review of Systems  Constitutional: Negative.   HENT: Negative.    Eyes: Negative.   Respiratory: Negative.    Cardiovascular:  Positive for palpitations.  Gastrointestinal: Negative.   Endocrine: Negative.   Genitourinary: Negative.   Musculoskeletal: Negative.   Skin: Negative.   Allergic/Immunologic: Negative.   Neurological: Negative.   Hematological: Negative.   Psychiatric/Behavioral: Negative.    All other systems reviewed and are negative.    Objective:    Physical Exam  BP (!) 159/96   Pulse 78   Ht 5\' 3"  (1.6 m)   Wt 120 lb 12.8 oz (54.8 kg)   BMI 21.40 kg/m  Wt Readings from Last 3 Encounters:  03/05/21 120 lb 12.8 oz (54.8 kg)  12/16/20 118 lb 4.8 oz (53.7 kg)  09/17/20 122 lb 1.6 oz (55.4  kg)     Health Maintenance Due  Topic Date Due   Hepatitis C Screening  Never done   TETANUS/TDAP  Never done   MAMMOGRAM  Never done   Zoster Vaccines- Shingrix (1 of 2) Never done   Pneumonia Vaccine 60+ Years old (1 - PCV) Never done   DEXA SCAN  Never done   COVID-19 Vaccine (3 - Booster for Pfizer series) 03/26/2020   INFLUENZA VACCINE  Never done    There are no preventive care reminders to display for this patient.  Lab Results  Component Value Date   TSH 0.02 (L) 09/16/2020   Lab Results  Component Value Date   WBC 4.8 12/26/2019   HGB 14.5 12/26/2019   HCT 43.8 12/26/2019   MCV 89.8 12/26/2019   PLT 275 12/26/2019   Lab Results  Component Value Date   NA 139 12/26/2019   K 4.5 12/26/2019   CO2 27 12/26/2019   GLUCOSE 76 12/26/2019   BUN 11 12/26/2019   CREATININE  0.94 (H) 12/26/2019   BILITOT 0.6 12/26/2019   ALKPHOS 63 05/24/2018   AST 18 12/26/2019   ALT 11 12/26/2019   PROT 6.8 12/26/2019   ALBUMIN 4.6 05/24/2018   CALCIUM 9.4 12/26/2019   ANIONGAP 15 05/24/2018   Lab Results  Component Value Date   CHOL 239 (H) 04/05/2020   Lab Results  Component Value Date   HDL 62 04/05/2020   Lab Results  Component Value Date   LDLCALC 158 (H) 04/05/2020   Lab Results  Component Value Date   TRIG 82 04/05/2020   Lab Results  Component Value Date   CHOLHDL 3.9 04/05/2020   No results found for: HGBA1C    Assessment & Plan:   Problem List Items Addressed This Visit       Cardiovascular and Mediastinum   Essential hypertension     Patient denies any chest pain or shortness of breath there is no history of palpitation or paroxysmal nocturnal dyspnea   patient was advised to follow low-salt low-cholesterol diet    ideally I want to keep systolic blood pressure below 130 mmHg, patient was asked to check blood pressure one times a week and give me a report on that.  Patient will be follow-up in 3 months  or earlier as needed, patient will call me back for any change in the cardiovascular symptoms Patient was advised to buy a book from local bookstore concerning blood pressure and read several chapters  every day.  This will be supplemented by some of the material we will give him from the office.  Patient should also utilize other resources like YouTube and Internet to learn more about the blood pressure and the diet.        Endocrine   Hypothyroidism due to Hashimoto's thyroiditis    Check thyroid function test again        Nervous and Auditory   Benign paroxysmal positional vertigo    Stable now        Other   Vaccine counseling    Patient is not willing to take COVID shot and flu shot      Pure hypercholesterolemia   Dizziness    Resolved.      Rapid heart rate - Primary    Electrocardiogram does not show any acute  changes      Relevant Orders   EKG 12-Lead    No orders of the defined types were placed in this encounter. I suggested  the patient stop her levothyroxine reduce it to 44 mcg p.o. daily she is not willing to do that.  We will check thyroid function test again in January  Follow-up: No follow-ups on file.    Cletis Athens, MD

## 2021-03-05 NOTE — Assessment & Plan Note (Signed)
Resolved

## 2021-03-09 ENCOUNTER — Other Ambulatory Visit: Payer: Self-pay

## 2021-03-09 ENCOUNTER — Emergency Department
Admission: EM | Admit: 2021-03-09 | Discharge: 2021-03-09 | Disposition: A | Discharge status: Home / Self Care | Payer: Medicare HMO | Attending: Emergency Medicine | Admitting: Emergency Medicine

## 2021-03-09 DIAGNOSIS — Z87891 Personal history of nicotine dependence: Secondary | ICD-10-CM | POA: Diagnosis not present

## 2021-03-09 DIAGNOSIS — I152 Hypertension secondary to endocrine disorders: Secondary | ICD-10-CM | POA: Diagnosis not present

## 2021-03-09 DIAGNOSIS — I10 Essential (primary) hypertension: Secondary | ICD-10-CM | POA: Diagnosis not present

## 2021-03-09 DIAGNOSIS — Z79899 Other long term (current) drug therapy: Secondary | ICD-10-CM | POA: Diagnosis not present

## 2021-03-09 DIAGNOSIS — E039 Hypothyroidism, unspecified: Secondary | ICD-10-CM | POA: Insufficient documentation

## 2021-03-09 LAB — BASIC METABOLIC PANEL
Anion gap: 8 (ref 5–15)
BUN: 10 mg/dL (ref 8–23)
CO2: 28 mmol/L (ref 22–32)
Calcium: 9.4 mg/dL (ref 8.9–10.3)
Chloride: 103 mmol/L (ref 98–111)
Creatinine, Ser: 0.77 mg/dL (ref 0.44–1.00)
GFR, Estimated: >60 mL/min (ref >60)
Glucose, Bld: 161 mg/dL — ABNORMAL HIGH (ref 70–99)
Potassium: 3.6 mmol/L (ref 3.5–5.1)
Sodium: 139 mmol/L (ref 135–145)

## 2021-03-09 LAB — CBC
HCT: 42.7 % (ref 36.0–46.0)
Hemoglobin: 14.4 g/dL (ref 12.0–15.0)
MCH: 29.8 pg (ref 26.0–34.0)
MCHC: 33.7 g/dL (ref 30.0–36.0)
MCV: 88.2 fL (ref 80.0–100.0)
Platelets: 216 10*3/uL (ref 150–400)
RBC: 4.84 MIL/uL (ref 3.87–5.11)
RDW: 11.9 % (ref 11.5–15.5)
WBC: 5 10*3/uL (ref 4.0–10.5)
nRBC: 0 % (ref 0.0–0.2)

## 2021-03-09 LAB — TROPONIN I (HIGH SENSITIVITY)
Troponin I (High Sensitivity): 4 ng/L (ref <18)
Troponin I (High Sensitivity): 5 ng/L (ref <18)

## 2021-03-09 LAB — T4, FREE: Free T4: 1.37 ng/dL — ABNORMAL HIGH (ref 0.61–1.12)

## 2021-03-09 MED ORDER — PROPRANOLOL HCL 20 MG/5ML PO SOLN: 20.0000 mg | Freq: Three times a day (TID) | ORAL | 0 refills | Status: DC

## 2021-03-09 MED ORDER — PROPRANOLOL HCL 20 MG PO TABS
10.0000 mg | ORAL_TABLET | Freq: Once | ORAL | Status: AC
  Administered 2021-03-09: 10 mg via ORAL
  Filled 2021-03-09: qty 1

## 2021-03-09 NOTE — ED Triage Notes (Signed)
Pt reports over the last 24 hours her BP has been going up. Pt denies sx's at this time. Pt reported had an irregular HR last week but saw her MD and none since then.

## 2021-03-09 NOTE — ED Provider Notes (Signed)
  Emergency Medicine Provider Triage Evaluation Note  Suzanne Ritter , a 70 y.o.female,  was evaluated in triage.  Pt complains of hypertension.  Patient states that over the last 24 hours she has had a marked increase in her blood pressure.  She states that she is currently on blood pressure medication, but is also been having a change in thyroid medications which she believes may be contributing to her blood pressure at this time.  Currently denies chest pain or shortness of breath   Review of Systems  Positive: None Negative: Denies fever, chest pain, vomiting  Physical Exam   Vitals:   03/09/21 1601 03/09/21 1602  BP: (!) 196/102   Pulse:  71  Resp:  18  Temp:  98.5 F (36.9 C)  SpO2:  93%   Gen:   Awake, no distress   Resp:  Normal effort  MSK:   Moves extremities without difficulty  Other:    Medical Decision Making  Given the patient's initial medical screening exam, the following diagnostic evaluation has been ordered. The patient will be placed in the appropriate treatment space, once one is available, to complete the evaluation and treatment. I have discussed the plan of care with the patient and I have advised the patient that an ED physician or mid-level practitioner will reevaluate their condition after the test results have been received, as the results may give them additional insight into the type of treatment they may need.    Diagnostics: EKG, labs  Treatments: none immediately   Teodoro Spray, PA 03/09/21 1609    Naaman Plummer, MD 03/09/21 [phone]

## 2021-03-11 ENCOUNTER — Telehealth: Payer: Self-pay | Admitting: *Deleted

## 2021-03-11 DIAGNOSIS — E038 Other specified hypothyroidism: Secondary | ICD-10-CM

## 2021-03-11 DIAGNOSIS — E063 Autoimmune thyroiditis: Secondary | ICD-10-CM

## 2021-03-11 MED ORDER — HYDROCHLOROTHIAZIDE 12.5 MG PO TABS: 12.5000 mg | ORAL_TABLET | Freq: Every day | ORAL | 1 refills | Status: DC

## 2021-03-11 MED ORDER — AMLODIPINE BESYLATE 5 MG PO TABS: 5.0000 mg | ORAL_TABLET | Freq: Every day | ORAL | 1 refills | Status: DC

## 2021-03-11 NOTE — Telephone Encounter (Signed)
Referral to endo placed. Rx for amlodipine and hctz sent to pharmacy.

## 2021-03-16 NOTE — ED Provider Notes (Signed)
[facility] Emergency Department Provider Note   ____________________________________________   Event Date/Time   First MD Initiated Contact with Patient 03/09/21 1856     (approximate)  I have reviewed the triage vital signs and the nursing notes.   HISTORY  Chief Complaint Hypertension    HPI Suzanne Ritter is a 70 y.o. female who presents for hypertension  LOCATION: Generalized DURATION: 24 hours prior to arrival TIMING: Worsening since onset SEVERITY: Severe QUALITY: Hypertension CONTEXT: Patient states that she has had difficulty controlling her hypertension and heart rate since having her thyroid removed and being put on levothyroxine.  Patient states that she had an irregular heartbeat last week due to elevated free T4 and today has been experiencing increasing blood pressure MODIFYING FACTORS: Denies any exacerbating or relieving factors ASSOCIATED SYMPTOMS: Denies   Per medical record review shows history of hypertension and thyroid disease status post thyroidectomy          Past Medical History:  Diagnosis Date   Hypertension    Thyroid disease     Patient Active Problem List   Diagnosis Date Noted   Rapid heart rate 03/05/2021   Benign paroxysmal positional vertigo 04/05/2020   Dizziness 04/05/2020   Pure hypercholesterolemia 01/05/2020   PE (physical exam), annual 01/05/2020   BMI 20.0-20.9, adult 10/13/2019   Hypothyroidism due to Hashimoto's thyroiditis 10/12/2019   Essential hypertension 10/12/2019   Vaccine counseling 10/12/2019    Past Surgical History:  Procedure Laterality Date   BRAIN SURGERY  2007   CERVICAL CONE BIOPSY     CESAREAN SECTION     COLONOSCOPY WITH PROPOFOL N/A 10/26/2016   Procedure: COLONOSCOPY WITH PROPOFOL;  Surgeon: Lollie Sails, MD;  Location: [facility] ENDOSCOPY;  Service: Endoscopy;  Laterality: N/A;   THYROID SURGERY      Prior to Admission medications   Medication Sig  Start Date End Date Taking? Authorizing Provider  amLODipine (NORVASC) 5 MG tablet Take 1 tablet (5 mg total) by mouth daily. 03/11/21   Cletis Athens, MD  hydrochlorothiazide (HYDRODIURIL) 12.5 MG tablet Take 1 tablet (12.5 mg total) by mouth daily. 03/11/21   Cletis Athens, MD  losartan (COZAAR) 50 MG tablet Take 1 tablet (50 mg total) by mouth daily. 12/16/20   Cletis Athens, MD  rosuvastatin (CRESTOR) 10 MG tablet Take 1 tablet (10 mg total) by mouth daily. 04/12/20   Beckie Salts, FNP    Allergies Naproxen  Family History  Problem Relation Age of Onset   Cancer Mother        kidney    Social History Social History   Tobacco Use   Smoking status: Former   Smokeless tobacco: Never  Substance Use Topics   Alcohol use: No   Drug use: No    Review of Systems Constitutional: No fever/chills Eyes: No visual changes. ENT: No sore throat. Cardiovascular: Denies chest pain.  Endorses hypertension Respiratory: Denies shortness of breath. Gastrointestinal: No abdominal pain.  No nausea, no vomiting.  No diarrhea. Genitourinary: Negative for dysuria. Musculoskeletal: Negative for acute arthralgias Skin: Negative for rash. Neurological: Negative for headaches, weakness/numbness/paresthesias in any extremity Psychiatric: Negative for suicidal ideation/homicidal ideation   ____________________________________________   PHYSICAL EXAM:  VITAL SIGNS: ED Triage Vitals  Enc Vitals Group     BP 03/09/21 1601 (!) 196/102     Pulse Rate 03/09/21 1602 71     Resp 03/09/21 1602 18     Temp 03/09/21 1602 98.5 F (36.9 C)  Temp Source 03/09/21 1602 Oral     SpO2 03/09/21 1602 93 %     Weight 03/09/21 1559 120 lb (54.4 kg)     Height 03/09/21 1559 5\' 3"  (1.6 m)     Head Circumference --      Peak Flow --      Pain Score 03/09/21 1559 0     Pain Loc --      Pain Edu? --      Excl. in [place]? --    Constitutional: Alert and oriented. Well appearing and in no acute  distress. Eyes: Conjunctivae are normal. PERRL. Head: Atraumatic. Nose: No congestion/rhinnorhea. Mouth/Throat: Mucous membranes are moist. Neck: No stridor Cardiovascular: Grossly normal heart sounds.  Good peripheral circulation. Respiratory: Normal respiratory effort.  No retractions. Gastrointestinal: Soft and nontender. No distention. Musculoskeletal: No obvious deformities Neurologic:  Normal speech and language. No gross focal neurologic deficits are appreciated. Skin:  Skin is warm and dry. No rash noted. Psychiatric: Mood and affect are normal. Speech and behavior are normal.  ____________________________________________   LABS (all labs ordered are listed, but only abnormal results are displayed)  Labs Reviewed  BASIC METABOLIC PANEL - Abnormal; Notable for the following components:      Result Value   Glucose, Bld 161 (*)    All other components within normal limits  T4, FREE - Abnormal; Notable for the following components:   Free T4 1.37 (*)    All other components within normal limits  CBC  TROPONIN I (HIGH SENSITIVITY)  TROPONIN I (HIGH SENSITIVITY)   ____________________________________________  EKG  ED ECG REPORT I, Naaman Plummer, the attending physician, personally viewed and interpreted this ECG.  Date: 03/10/2021 EKG Time: 1605 Rate: 72 Rhythm: normal sinus rhythm QRS Axis: normal Intervals: normal ST/T Wave abnormalities: normal Narrative Interpretation: no evidence of acute ischemia   PROCEDURES  Procedure(s) performed (including Critical Care):  .1-3 Lead EKG Interpretation Performed by: Naaman Plummer, MD Authorized by: Naaman Plummer, MD     Interpretation: normal     ECG rate:  78   ECG rate assessment: normal     Rhythm: sinus rhythm     Ectopy: none     Conduction: normal     ____________________________________________   INITIAL IMPRESSION / ASSESSMENT AND PLAN / ED COURSE  As part of my medical decision making, I  reviewed the following data within the electronic medical record, if available:  Nursing notes reviewed and incorporated, Labs reviewed, EKG interpreted, Old chart reviewed, Radiograph reviewed and Notes from prior ED visits reviewed and incorporated      Presents to the emergency department complaining of high blood pressure. Patient is otherwise asymptomatic without confusion, chest pain, hematuria, or SOB. Denies nonadherence to antihypertensive regimen DDx: CV, AMI, heart failure, renal infarction or failure or other end organ damage.  Disposition: Discussed with patient their elevated blood pressure and need for close outpatient management of their hypertension. Will provide a prescription for all 20 mg PO 3 times daily given elevated free T4 and arrange for the patient to follow up in a primary care clinic      ____________________________________________   FINAL CLINICAL IMPRESSION(S) / ED DIAGNOSES  Final diagnoses:  Hypertension due to endocrine disorder     ED Discharge Orders          Ordered    propranolol (INDERAL) 20 MG/5ML solution  3 times daily,   Status:  Discontinued        03/09/21  2013             Note:  This document was prepared using Dragon voice recognition software and may include unintentional dictation errors.    Naaman Plummer, MD 03/16/21 [phone]

## 2021-03-17 ENCOUNTER — Ambulatory Visit (INDEPENDENT_AMBULATORY_CARE_PROVIDER_SITE_OTHER): Payer: Medicare HMO | Admitting: *Deleted

## 2021-03-17 DIAGNOSIS — Z0000 Encounter for general adult medical examination without abnormal findings: Secondary | ICD-10-CM | POA: Diagnosis not present

## 2021-03-17 NOTE — Progress Notes (Signed)
Subjective:   Suzanne Ritter is a 70 y.o. female who presents for Medicare Annual (Subsequent) preventive examination.  I discussed the limitations of evaluation and management by telemedicine and the availability of in person appointments. Patient expressed understanding and agreed to proceed.   Visit performed using audio  Patient:home Provider:home   Review of Systems    Defer to provider Cardiac Risk Factors include: hypertension     Objective:    Today's Vitals   03/17/21 0923  PainSc: 0-No pain   There is no height or weight on file to calculate BMI.  Advanced Directives 03/17/2021 05/24/2018 03/19/2018 06/19/2017  Does Patient Have a Medical Advance Directive? No No No No  Would patient like information on creating a medical advance directive? No - Patient declined No - Patient declined - No - Patient declined    Current Medications (verified) Outpatient Encounter Medications as of 03/17/2021  Medication Sig   amLODipine (NORVASC) 5 MG tablet Take 1 tablet (5 mg total) by mouth daily.   hydrochlorothiazide (HYDRODIURIL) 12.5 MG tablet Take 1 tablet (12.5 mg total) by mouth daily.   losartan (COZAAR) 50 MG tablet Take 1 tablet (50 mg total) by mouth daily.   [DISCONTINUED] rosuvastatin (CRESTOR) 10 MG tablet Take 1 tablet (10 mg total) by mouth daily.   No facility-administered encounter medications on file as of 03/17/2021.    Allergies (verified) Naproxen   History: Past Medical History:  Diagnosis Date   Hypertension    Thyroid disease    Past Surgical History:  Procedure Laterality Date   BRAIN SURGERY  2007   CERVICAL CONE BIOPSY     CESAREAN SECTION     COLONOSCOPY WITH PROPOFOL N/A 10/26/2016   Procedure: COLONOSCOPY WITH PROPOFOL;  Surgeon: Lollie Sails, MD;  Location: [facility] ENDOSCOPY;  Service: Endoscopy;  Laterality: N/A;   THYROID SURGERY     Family History  Problem Relation Age of Onset   Cancer Mother        kidney    Social History   Socioeconomic History   Marital status: Married    Spouse name: Not on file   Number of children: 2   Years of education: Not on file   Highest education level: Bachelor's degree (e.g., BA, AB, BS)  Occupational History   Not on file  Tobacco Use   Smoking status: Former   Smokeless tobacco: Never  Substance and Sexual Activity   Alcohol use: No   Drug use: No   Sexual activity: Yes    Comment: rarely  Other Topics Concern   Not on file  Social History Narrative   Not on file   Social Determinants of Health   Financial Resource Strain: Low Risk    Difficulty of Paying Living Expenses: Not hard at all  Food Insecurity: Food Insecurity Present   Worried About Charity fundraiser in the Last Year: Sometimes true   Ran Out of Food in the Last Year: Sometimes true  Transportation Needs: No Transportation Needs   Lack of Transportation (Medical): No   Lack of Transportation (Non-Medical): No  Physical Activity: Insufficiently Active   Days of Exercise per Week: 4 days   Minutes of Exercise per Session: 30 min  Stress: No Stress Concern Present   Feeling of Stress : Not at all  Social Connections: Moderately Integrated   Frequency of Communication with Friends and Family: More than three times a week   Frequency of Social Gatherings with Friends and Family:  More than three times a week   Attends Religious Services: More than 4 times per year   Active Member of Clubs or Organizations: No   Attends Archivist Meetings: Never   Marital Status: Married    Tobacco Counseling Counseling given: Not Answered   Clinical Intake:  Pre-visit preparation completed: Yes  Pain : No/denies pain Pain Score: 0-No pain     Nutritional Risks: None Diabetes: No  How often do you need to have someone help you when you read instructions, pamphlets, or other written materials from your doctor or pharmacy?: 1 - Never What is the last grade level you  completed in school?: bachelors  Diabetic?No  Interpreter Needed?: No  Information entered by :: Lacretia Nicks, [place] of Daily Living In your present state of health, do you have any difficulty performing the following activities: 03/17/2021 03/16/2021  Hearing? N N  Vision? N N  Difficulty concentrating or making decisions? N N  Walking or climbing stairs? N N  Dressing or bathing? N N  Doing errands, shopping? N N  Preparing Food and eating ? N N  Using the Toilet? N N  In the past six months, have you accidently leaked urine? N N  Do you have problems with loss of bowel control? N N  Managing your Medications? N N  Managing your Finances? N N  Housekeeping or managing your Housekeeping? N N  Some recent data might be hidden    Patient Care Team: Cletis Athens, MD as PCP - General (Internal Medicine)  Indicate any recent Medical Services you may have received from other than [facility] providers in the past year (date may be approximate).     Assessment:   This is a routine wellness examination for [place].  Hearing/Vision screen No results found.  Dietary issues and exercise activities discussed: Current Exercise Habits: Home exercise routine, Type of exercise: walking, Time (Minutes): 30, Frequency (Times/Week): 4, Weekly Exercise (Minutes/Week): 120, Intensity: Mild, Exercise limited by: None identified   Goals Addressed   None    Depression Screen PHQ 2/9 Scores 03/17/2021 12/16/2020 01/05/2020  PHQ - 2 Score 0 0 0    Fall Risk Fall Risk  03/17/2021 03/16/2021 12/16/2020 01/05/2020 02/21/2019  Falls in the past year? 0 0 0 0 0  Comment - - - - Emmi Telephone Survey: data to providers prior to load  Number falls in past yr: 0 0 0 0 -  Injury with Fall? 0 - 0 0 -  Risk for fall due to : No Fall Risks - No Fall Risks - -  Follow up Falls evaluation completed - Falls evaluation completed - -    FALL RISK PREVENTION PERTAINING TO THE HOME:  Any stairs  in or around the home? No  If so, are there any without handrails? No  Home free of loose throw rugs in walkways, pet beds, electrical cords, etc? Yes  Adequate lighting in your home to reduce risk of falls? Yes   ASSISTIVE DEVICES UTILIZED TO PREVENT FALLS:  Life alert? No  Use of a cane, walker or w/c? No  Grab bars in the bathroom? No  Shower chair or bench in shower? No  Elevated toilet seat or a handicapped toilet? No   TIMED UP AND GO:  Was the test performed? No .   Cognitive Function: MMSE - Mini Mental State Exam 03/17/2021  Not completed: Unable to complete     6CIT Screen 03/17/2021  What Year?  0 points  What month? 0 points  What time? 0 points  Count back from 20 0 points  Months in reverse 0 points  Repeat phrase 0 points  Total Score 0    Immunizations Immunization History  Administered Date(s) Administered   PFIZER(Purple Top)SARS-COV-2 Vaccination 01/09/2020, 01/30/2020    Patient declines TDAP   Flu Vaccine status: Declined, Education has been provided regarding the importance of this vaccine but patient still declined. Advised may receive this vaccine at local pharmacy or Health Dept. Aware to provide a copy of the vaccination record if obtained from local pharmacy or Health Dept. Verbalized acceptance and understanding.  Pneumococcal vaccine status: Declined,  Education has been provided regarding the importance of this vaccine but patient still declined. Advised may receive this vaccine at local pharmacy or Health Dept. Aware to provide a copy of the vaccination record if obtained from local pharmacy or Health Dept. Verbalized acceptance and understanding.   Covid-19 vaccine status: Declined, Education has been provided regarding the importance of this vaccine but patient still declined. Advised may receive this vaccine at local pharmacy or Health Dept.or vaccine clinic. Aware to provide a copy of the vaccination record if obtained from local pharmacy  or Health Dept. Verbalized acceptance and understanding.  Qualifies for Shingles Vaccine? Yes   Zostavax completed No   Shingrix Completed?: No.    Education has been provided regarding the importance of this vaccine. Patient has been advised to call insurance company to determine out of pocket expense if they have not yet received this vaccine. Advised may also receive vaccine at local pharmacy or Health Dept. Verbalized acceptance and understanding.  Screening Tests Health Maintenance  Topic Date Due   Hepatitis C Screening  Never done   MAMMOGRAM  Never done   COVID-19 Vaccine (3 - Booster for Pfizer series) 04/02/2021 (Originally 03/26/2020)   Zoster Vaccines- Shingrix (1 of 2) 06/15/2021 (Originally 04/07/1999)   INFLUENZA VACCINE  06/27/2021 (Originally 10/28/2020)   Pneumonia Vaccine 58+ Years old (1 - PCV) 03/17/2022 (Originally 04/06/2014)   DEXA SCAN  03/17/2022 (Originally 04/06/2014)   TETANUS/TDAP  03/17/2022 (Originally 04/06/1968)   COLONOSCOPY (Pts 45-47yrs Insurance coverage will need to be confirmed)  10/27/2026   HPV VACCINES  Aged Out    Health Maintenance  Health Maintenance Due  Topic Date Due   Hepatitis C Screening  Never done   MAMMOGRAM  Never done    Colorectal cancer screening: Type of screening: Colonoscopy. Completed 10/26/2016. Repeat every 10 years  Patient would like to do mammogram at later time     Lung Cancer Screening: (Low Dose CT Chest recommended if Age 54-80 years, 30 pack-year currently smoking OR have quit w/in 15years.) does not qualify.   Lung Cancer Screening Referral: NA  Additional Screening:  Hepatitis C Screening: does qualify; Will complete tomorrow with other labs   Vision Screening: Recommended annual ophthalmology exams for early detection of glaucoma and other disorders of the eye. Is the patient up to date with their annual eye exam?  Yes  Who is the provider or what is the name of the office in which the patient attends annual  eye exams? [facility]  If pt is not established with a provider, would they like to be referred to a provider to establish care?  Patient is already established and had last exam in spring of this year  .   Dental Screening: Recommended annual dental exams for proper oral hygiene  Community Resource Referral / Chronic  Care Management: CRR required this visit?  No   CCM required this visit?  No      Plan:     I have personally reviewed and noted the following in the patients chart:   Medical and social history Use of alcohol, tobacco or illicit drugs  Current medications and supplements including opioid prescriptions.  Functional ability and status Nutritional status Physical activity Advanced directives List of other physicians Hospitalizations, surgeries, and ER visits in previous 12 months Vitals Screenings to include cognitive, depression, and falls Referrals and appointments  In addition, I have reviewed and discussed with patient certain preventive protocols, quality metrics, and best practice recommendations. A written personalized care plan for preventive services as well as general preventive health recommendations were provided to patient.     Lacretia Nicks, [place]   03/17/2021   Nurse Notes:  Ms. Normajean Baxter , Thank you for taking time to come for your Medicare Wellness Visit. I appreciate your ongoing commitment to your health goals. Please review the following plan we discussed and let me know if I can assist you in the future.   These are the goals we discussed:  Goals   None     This is a list of the screening recommended for you and due dates:  Health Maintenance  Topic Date Due   Hepatitis C Screening: USPSTF Recommendation to screen - Ages 50-79 yo.  Never done   Mammogram  Never done   COVID-19 Vaccine (3 - Booster for Pfizer series) 04/02/2021*   Zoster (Shingles) Vaccine (1 of 2) 06/15/2021*   Flu Shot  06/27/2021*   Pneumonia Vaccine (1 -  PCV) 03/17/2022*   DEXA scan (bone density measurement)  03/17/2022*   Tetanus Vaccine  03/17/2022*   Colon Cancer Screening  10/27/2026   HPV Vaccine  Aged Out  *Topic was postponed. The date shown is not the original due date.      Time spent 30 min

## 2021-03-17 NOTE — Progress Notes (Signed)
I have reviewed this visit and agree with the documentation.   

## 2021-03-18 ENCOUNTER — Other Ambulatory Visit: Payer: Self-pay

## 2021-03-18 ENCOUNTER — Other Ambulatory Visit (INDEPENDENT_AMBULATORY_CARE_PROVIDER_SITE_OTHER): Payer: Medicare HMO

## 2021-03-18 DIAGNOSIS — E063 Autoimmune thyroiditis: Secondary | ICD-10-CM

## 2021-03-18 DIAGNOSIS — E038 Other specified hypothyroidism: Secondary | ICD-10-CM

## 2021-03-20 DIAGNOSIS — R002 Palpitations: Secondary | ICD-10-CM | POA: Diagnosis not present

## 2021-03-20 DIAGNOSIS — E890 Postprocedural hypothyroidism: Secondary | ICD-10-CM | POA: Diagnosis not present

## 2021-03-21 LAB — T4: T4, Total: 6.9 ug/dL (ref 5.1–11.9)

## 2021-03-21 LAB — TSH: TSH: 0.61 mIU/L (ref 0.40–4.50)

## 2021-03-21 LAB — T3: T3, Total: 66 ng/dL — ABNORMAL LOW (ref 76–181)

## 2021-04-08 ENCOUNTER — Other Ambulatory Visit: Payer: Self-pay

## 2021-04-08 ENCOUNTER — Encounter: Payer: Self-pay | Admitting: Internal Medicine

## 2021-04-08 ENCOUNTER — Ambulatory Visit (INDEPENDENT_AMBULATORY_CARE_PROVIDER_SITE_OTHER): Payer: Medicare HMO | Admitting: Internal Medicine

## 2021-04-08 VITALS — BP 154/90 | HR 69 | Ht 63.0 in | Wt 121.4 lb

## 2021-04-08 DIAGNOSIS — I10 Essential (primary) hypertension: Secondary | ICD-10-CM | POA: Diagnosis not present

## 2021-04-08 DIAGNOSIS — E7800 Pure hypercholesterolemia, unspecified: Secondary | ICD-10-CM | POA: Diagnosis not present

## 2021-04-08 DIAGNOSIS — E038 Other specified hypothyroidism: Secondary | ICD-10-CM | POA: Diagnosis not present

## 2021-04-08 DIAGNOSIS — H8110 Benign paroxysmal vertigo, unspecified ear: Secondary | ICD-10-CM

## 2021-04-08 DIAGNOSIS — E063 Autoimmune thyroiditis: Secondary | ICD-10-CM

## 2021-04-08 DIAGNOSIS — R000 Tachycardia, unspecified: Secondary | ICD-10-CM | POA: Diagnosis not present

## 2021-04-08 NOTE — Assessment & Plan Note (Addendum)
Patient with positive family history.  Her labs show hyperthyroidism/, thyroid medication was stopped and refer to endocrine, I feel  that she has a recurrence of hyperthyroidism.

## 2021-04-08 NOTE — Assessment & Plan Note (Signed)
Hypercholesterolemia  I advised the patient to follow Mediterranean diet This diet is rich in fruits vegetables and whole grain, and This diet is also rich in fish and lean meat Patient should also eat a handful of almonds or walnuts daily Recent heart study indicated that average follow-up on this kind of diet reduces the cardiovascular mortality by 50 to 70%== 

## 2021-04-08 NOTE — Progress Notes (Signed)
Established Patient Office Visit  Subjective:  Patient ID: Suzanne Ritter, female    DOB: October 27, 1950  Age: 71 y.o. MRN: [id number]  CC:  Chief Complaint  Patient presents with   Thyroid Problem    Thyroid Problem   Suzanne Ritter presents for general check up  Past Medical History:  Diagnosis Date   Hypertension    Thyroid disease     Past Surgical History:  Procedure Laterality Date   BRAIN SURGERY  2007   CERVICAL CONE BIOPSY     CESAREAN SECTION     COLONOSCOPY WITH PROPOFOL N/A 10/26/2016   Procedure: COLONOSCOPY WITH PROPOFOL;  Surgeon: Lollie Sails, MD;  Location: [facility] ENDOSCOPY;  Service: Endoscopy;  Laterality: N/A;   THYROID SURGERY      Family History  Problem Relation Age of Onset   Cancer Mother        kidney    Social History   Socioeconomic History   Marital status: Married    Spouse name: Not on file   Number of children: 2   Years of education: Not on file   Highest education level: Bachelor's degree (e.g., BA, AB, BS)  Occupational History   Not on file  Tobacco Use   Smoking status: Former   Smokeless tobacco: Never  Substance and Sexual Activity   Alcohol use: No   Drug use: No   Sexual activity: Yes    Comment: rarely  Other Topics Concern   Not on file  Social History Narrative   Not on file   Social Determinants of Health   Financial Resource Strain: Low Risk    Difficulty of Paying Living Expenses: Not hard at all  Food Insecurity: Food Insecurity Present   Worried About Charity fundraiser in the Last Year: Sometimes true   Ran Out of Food in the Last Year: Sometimes true  Transportation Needs: No Transportation Needs   Lack of Transportation (Medical): No   Lack of Transportation (Non-Medical): No  Physical Activity: Insufficiently Active   Days of Exercise per Week: 4 days   Minutes of Exercise per Session: 30 min  Stress: No Stress Concern Present   Feeling of Stress : Not at all  Social  Connections: Moderately Integrated   Frequency of Communication with Friends and Family: More than three times a week   Frequency of Social Gatherings with Friends and Family: More than three times a week   Attends Religious Services: More than 4 times per year   Active Member of [employer] or Organizations: No   Attends Archivist Meetings: Never   Marital Status: Married  Human resources officer Violence: Not At Risk   Fear of Current or Ex-Partner: No   Emotionally Abused: No   Physically Abused: No   Sexually Abused: No     Current Outpatient Medications:    amLODipine (NORVASC) 5 MG tablet, Take 1 tablet (5 mg total) by mouth daily., Disp: 90 tablet, Rfl: 1   hydrochlorothiazide (HYDRODIURIL) 12.5 MG tablet, Take 1 tablet (12.5 mg total) by mouth daily., Disp: 90 tablet, Rfl: 1   losartan (COZAAR) 50 MG tablet, Take 1 tablet (50 mg total) by mouth daily., Disp: 30 tablet, Rfl: 6   Allergies  Allergen Reactions   Naproxen Other (See Comments)    Other Reaction: GI Upset    ROS Review of Systems  Constitutional: Negative.   HENT: Negative.    Eyes: Negative.   Respiratory: Negative.    Cardiovascular: Negative.  Gastrointestinal: Negative.   Endocrine: Negative.   Genitourinary: Negative.   Musculoskeletal: Negative.   Skin: Negative.   Allergic/Immunologic: Negative.   Neurological: Negative.   Hematological: Negative.   Psychiatric/Behavioral: Negative.    All other systems reviewed and are negative.    Objective:    Physical Exam Vitals reviewed.  Constitutional:      Appearance: Normal appearance.  HENT:     Mouth/Throat:     Mouth: Mucous membranes are moist.  Eyes:     Pupils: Pupils are equal, round, and reactive to light.  Neck:     Vascular: No carotid bruit.  Cardiovascular:     Rate and Rhythm: Normal rate and regular rhythm.     Pulses: Normal pulses.     Heart sounds: Normal heart sounds.  Pulmonary:     Effort: Pulmonary effort is normal.      Breath sounds: Normal breath sounds.  Abdominal:     General: Bowel sounds are normal.     Palpations: Abdomen is soft. There is no hepatomegaly, splenomegaly or mass.     Tenderness: There is no abdominal tenderness.     Hernia: No hernia is present.  Musculoskeletal:        General: No tenderness.     Cervical back: Neck supple.     Right lower leg: No edema.     Left lower leg: No edema.  Skin:    Findings: No rash.  Neurological:     Mental Status: She is alert and oriented to person, place, and time.     Motor: No weakness.  Psychiatric:        Mood and Affect: Mood and affect normal.        Behavior: Behavior normal.    BP (!) 154/90    Pulse 69    Ht 5\' 3"  (1.6 m)    Wt 121 lb 6.4 oz (55.1 kg)    BMI 21.51 kg/m  Wt Readings from Last 3 Encounters:  04/08/21 121 lb 6.4 oz (55.1 kg)  03/09/21 120 lb (54.4 kg)  03/05/21 120 lb 12.8 oz (54.8 kg)     Health Maintenance Due  Topic Date Due   Hepatitis C Screening  Never done   MAMMOGRAM  Never done    There are no preventive care reminders to display for this patient.  Lab Results  Component Value Date   TSH 0.61 03/18/2021   Lab Results  Component Value Date   WBC 5.0 03/09/2021   HGB 14.4 03/09/2021   HCT 42.7 03/09/2021   MCV 88.2 03/09/2021   PLT 216 03/09/2021   Lab Results  Component Value Date   NA 139 03/09/2021   K 3.6 03/09/2021   CO2 28 03/09/2021   GLUCOSE 161 (H) 03/09/2021   BUN 10 03/09/2021   CREATININE 0.77 03/09/2021   BILITOT 0.6 12/26/2019   ALKPHOS 63 05/24/2018   AST 18 12/26/2019   ALT 11 12/26/2019   PROT 6.8 12/26/2019   ALBUMIN 4.6 05/24/2018   CALCIUM 9.4 03/09/2021   ANIONGAP 8 03/09/2021   Lab Results  Component Value Date   CHOL 239 (H) 04/05/2020   Lab Results  Component Value Date   HDL 62 04/05/2020   Lab Results  Component Value Date   LDLCALC 158 (H) 04/05/2020   Lab Results  Component Value Date   TRIG 82 04/05/2020   Lab Results  Component  Value Date   CHOLHDL 3.9 04/05/2020   No results found for:  HGBA1C    Assessment & Plan:   Problem List Items Addressed This Visit       Cardiovascular and Mediastinum   Essential hypertension - Primary    Blood pressure is stable today, she complains of tachycardia, she does not want to take losartan,        Endocrine   Hypothyroidism due to Hashimoto's thyroiditis    Patient with positive family history.  Her labs show hyperthyroidism/, thyroid medication was stopped and refer to endocrine, I feel  that she has a recurrence of hyperthyroidism.           Nervous and Auditory   Benign paroxysmal positional vertigo    Stable at the present time        Other   Pure hypercholesterolemia    Hypercholesterolemia  I advised the patient to follow Mediterranean diet This diet is rich in fruits vegetables and whole grain, and This diet is also rich in fish and lean meat Patient should also eat a handful of almonds or walnuts daily Recent heart study indicated that average follow-up on this kind of diet reduces the cardiovascular mortality by 50 to 70%==      Rapid heart rate    she had a Holter done at the clinic and appointment with Dr. Clayborn Bigness I told her to continue, his appointment with Dr. Clayborn Bigness       No orders of the defined types were placed in this encounter.   Follow-up: No follow-ups on file.    Cletis Athens, MD

## 2021-04-08 NOTE — Assessment & Plan Note (Signed)
Blood pressure is stable today, she complains of tachycardia, she does not want to take losartan,

## 2021-04-08 NOTE — Assessment & Plan Note (Addendum)
she had a Holter done at the clinic and appointment with Dr. Clayborn Bigness I told her to continue, his appointment with Dr. Clayborn Bigness

## 2021-04-08 NOTE — Assessment & Plan Note (Signed)
Stable at the present time. 

## 2021-04-09 ENCOUNTER — Other Ambulatory Visit: Payer: Self-pay | Admitting: *Deleted

## 2021-04-09 ENCOUNTER — Other Ambulatory Visit: Payer: Self-pay | Admitting: Internal Medicine

## 2021-04-09 MED ORDER — LINACLOTIDE 72 MCG PO CAPS: 72.0000 ug | ORAL_CAPSULE | Freq: Every day | ORAL | 1 refills | Status: DC

## 2021-04-09 MED ORDER — METOPROLOL SUCCINATE ER 25 MG PO TB24: 25.0000 mg | ORAL_TABLET | Freq: Every day | ORAL | 1 refills | Status: DC

## 2021-04-09 NOTE — Addendum Note (Signed)
Addended by: Alois Cliche on: 04/09/2021 09:37 AM   Modules accepted: Orders

## 2021-04-11 ENCOUNTER — Ambulatory Visit
Admission: RE | Admit: 2021-04-11 | Discharge: 2021-04-11 | Disposition: A | Discharge status: Home / Self Care | Payer: Medicare HMO | Source: Ambulatory Visit | Attending: Internal Medicine | Admitting: Internal Medicine

## 2021-04-11 ENCOUNTER — Other Ambulatory Visit: Payer: Self-pay

## 2021-04-11 DIAGNOSIS — E063 Autoimmune thyroiditis: Secondary | ICD-10-CM | POA: Insufficient documentation

## 2021-04-11 DIAGNOSIS — E038 Other specified hypothyroidism: Secondary | ICD-10-CM | POA: Diagnosis not present

## 2021-04-11 DIAGNOSIS — E039 Hypothyroidism, unspecified: Secondary | ICD-10-CM | POA: Diagnosis not present

## 2021-04-11 DIAGNOSIS — E890 Postprocedural hypothyroidism: Secondary | ICD-10-CM | POA: Diagnosis not present

## 2021-04-14 ENCOUNTER — Ambulatory Visit: Payer: Medicare HMO | Admitting: Internal Medicine

## 2021-04-16 DIAGNOSIS — R002 Palpitations: Secondary | ICD-10-CM | POA: Diagnosis not present

## 2021-04-16 DIAGNOSIS — I10 Essential (primary) hypertension: Secondary | ICD-10-CM | POA: Diagnosis not present

## 2021-04-16 DIAGNOSIS — E039 Hypothyroidism, unspecified: Secondary | ICD-10-CM | POA: Diagnosis not present

## 2021-04-16 DIAGNOSIS — E063 Autoimmune thyroiditis: Secondary | ICD-10-CM | POA: Diagnosis not present

## 2021-04-24 DIAGNOSIS — I10 Essential (primary) hypertension: Secondary | ICD-10-CM | POA: Diagnosis not present

## 2021-04-24 DIAGNOSIS — R0602 Shortness of breath: Secondary | ICD-10-CM | POA: Diagnosis not present

## 2021-04-24 DIAGNOSIS — R000 Tachycardia, unspecified: Secondary | ICD-10-CM | POA: Diagnosis not present

## 2021-04-24 DIAGNOSIS — R002 Palpitations: Secondary | ICD-10-CM | POA: Diagnosis not present

## 2021-04-24 DIAGNOSIS — E079 Disorder of thyroid, unspecified: Secondary | ICD-10-CM | POA: Diagnosis not present

## 2021-04-30 DIAGNOSIS — E039 Hypothyroidism, unspecified: Secondary | ICD-10-CM | POA: Diagnosis not present

## 2021-04-30 DIAGNOSIS — E063 Autoimmune thyroiditis: Secondary | ICD-10-CM | POA: Diagnosis not present

## 2021-04-30 DIAGNOSIS — R002 Palpitations: Secondary | ICD-10-CM | POA: Diagnosis not present

## 2021-04-30 DIAGNOSIS — I10 Essential (primary) hypertension: Secondary | ICD-10-CM | POA: Diagnosis not present

## 2021-05-07 ENCOUNTER — Other Ambulatory Visit: Payer: Self-pay | Admitting: Internal Medicine

## 2021-05-08 ENCOUNTER — Ambulatory Visit (INDEPENDENT_AMBULATORY_CARE_PROVIDER_SITE_OTHER): Payer: Medicare HMO | Admitting: Nurse Practitioner

## 2021-05-08 ENCOUNTER — Other Ambulatory Visit: Payer: Self-pay

## 2021-05-08 VITALS — BP 134/75 | HR 58 | Ht 63.0 in | Wt 116.1 lb

## 2021-05-08 DIAGNOSIS — R000 Tachycardia, unspecified: Secondary | ICD-10-CM | POA: Diagnosis not present

## 2021-05-08 DIAGNOSIS — E038 Other specified hypothyroidism: Secondary | ICD-10-CM

## 2021-05-08 DIAGNOSIS — K5900 Constipation, unspecified: Secondary | ICD-10-CM

## 2021-05-08 DIAGNOSIS — I10 Essential (primary) hypertension: Secondary | ICD-10-CM | POA: Diagnosis not present

## 2021-05-08 DIAGNOSIS — E063 Autoimmune thyroiditis: Secondary | ICD-10-CM

## 2021-05-08 NOTE — Progress Notes (Signed)
Established Patient Office Visit  Subjective:  Patient ID: Suzanne Ritter, female    DOB: 02/20/1951  Age: 71 y.o. MRN: [id number]  CC:  Chief Complaint  Patient presents with   Medication Concerns    Patient has some concerns about her medications not working. Patient complains of constipation.    HPI Suzanne Ritter presents today to discuss about her blood pressure medications. She had episodes of high BP in December 2022 and has been fluctuating. Pt states now her bp is stable.  Past Medical History:  Diagnosis Date   Hypertension    Thyroid disease     Past Surgical History:  Procedure Laterality Date   BRAIN SURGERY  2007   CERVICAL CONE BIOPSY     CESAREAN SECTION     COLONOSCOPY WITH PROPOFOL N/A 10/26/2016   Procedure: COLONOSCOPY WITH PROPOFOL;  Surgeon: Lollie Sails, MD;  Location: [facility] ENDOSCOPY;  Service: Endoscopy;  Laterality: N/A;   THYROID SURGERY      Family History  Problem Relation Age of Onset   Cancer Mother        kidney    Social History   Socioeconomic History   Marital status: Married    Spouse name: Not on file   Number of children: 2   Years of education: Not on file   Highest education level: Bachelor's degree (e.g., BA, AB, BS)  Occupational History   Not on file  Tobacco Use   Smoking status: Former   Smokeless tobacco: Never  Substance and Sexual Activity   Alcohol use: No   Drug use: No   Sexual activity: Yes    Comment: rarely  Other Topics Concern   Not on file  Social History Narrative   Not on file   Social Determinants of Health   Financial Resource Strain: Low Risk    Difficulty of Paying Living Expenses: Not hard at all  Food Insecurity: Food Insecurity Present   Worried About Charity fundraiser in the Last Year: Sometimes true   Ran Out of Food in the Last Year: Sometimes true  Transportation Needs: No Transportation Needs   Lack of Transportation (Medical): No   Lack of  Transportation (Non-Medical): No  Physical Activity: Insufficiently Active   Days of Exercise per Week: 4 days   Minutes of Exercise per Session: 30 min  Stress: No Stress Concern Present   Feeling of Stress : Not at all  Social Connections: Moderately Integrated   Frequency of Communication with Friends and Family: More than three times a week   Frequency of Social Gatherings with Friends and Family: More than three times a week   Attends Religious Services: More than 4 times per year   Active Member of [employer] or Organizations: No   Attends Archivist Meetings: Never   Marital Status: Married  Human resources officer Violence: Not At Risk   Fear of Current or Ex-Partner: No   Emotionally Abused: No   Physically Abused: No   Sexually Abused: No    Outpatient Medications Prior to Visit  Medication Sig Dispense Refill   amLODipine (NORVASC) 5 MG tablet Take 1 tablet (5 mg total) by mouth daily. 90 tablet 1   hydrochlorothiazide (HYDRODIURIL) 12.5 MG tablet Take 1 tablet (12.5 mg total) by mouth daily. 90 tablet 1   levothyroxine (SYNTHROID) 88 MCG tablet Take 88 mcg by mouth daily.     metoprolol succinate (TOPROL-XL) 25 MG 24 hr tablet TAKE 1 TABLET(25 MG) BY  MOUTH DAILY 90 tablet 3   linaclotide (LINZESS) 72 MCG capsule Take 1 capsule (72 mcg total) by mouth daily before breakfast. (Patient not taking: Reported on 05/08/2021) 30 capsule 1   losartan (COZAAR) 50 MG tablet Take 1 tablet (50 mg total) by mouth daily. (Patient not taking: Reported on 05/08/2021) 30 tablet 6   No facility-administered medications prior to visit.    Allergies  Allergen Reactions   Naproxen Other (See Comments)    Other Reaction: GI Upset    ROS Review of Systems  Constitutional:  Negative for activity change and appetite change.  HENT:  Negative for congestion and ear pain.   Eyes:  Negative for pain and discharge.  Respiratory:  Negative for apnea, chest tightness and shortness of breath.    Cardiovascular:  Negative for chest pain and leg swelling.  Gastrointestinal:  Positive for constipation. Negative for abdominal distention and abdominal pain.  Genitourinary:  Negative for difficulty urinating.  Skin:  Negative for color change.  Neurological:  Negative for dizziness, speech difficulty and headaches.  Psychiatric/Behavioral:  Negative for agitation, behavioral problems and confusion. The patient is not nervous/anxious.      Objective:    Physical Exam Constitutional:      Appearance: Normal appearance.  HENT:     Head: Normocephalic.     Nose: Nose normal.     Mouth/Throat:     Mouth: Mucous membranes are moist.  Eyes:     Pupils: Pupils are equal, round, and reactive to light.  Cardiovascular:     Rate and Rhythm: Normal rate and regular rhythm.  Pulmonary:     Effort: Pulmonary effort is normal. No respiratory distress.     Breath sounds: Normal breath sounds.  Abdominal:     General: Abdomen is flat. Bowel sounds are normal.     Palpations: Abdomen is soft.  Skin:    General: Skin is warm.     Capillary Refill: Capillary refill takes less than 2 seconds.  Neurological:     Mental Status: She is oriented to person, place, and time. Mental status is at baseline.  Psychiatric:        Mood and Affect: Mood normal.        Behavior: Behavior normal.        Thought Content: Thought content normal.        Judgment: Judgment normal.    BP 134/75    Pulse (!) 58    Ht 5\' 3"  (1.6 m)    Wt 116 lb 1.6 oz (52.7 kg)    BMI 20.57 kg/m  Wt Readings from Last 3 Encounters:  05/08/21 116 lb 1.6 oz (52.7 kg)  04/08/21 121 lb 6.4 oz (55.1 kg)  03/09/21 120 lb (54.4 kg)     Health Maintenance Due  Topic Date Due   Hepatitis C Screening  Never done   MAMMOGRAM  Never done   COVID-19 Vaccine (3 - Booster for Pfizer series) 03/26/2020    There are no preventive care reminders to display for this patient.  Lab Results  Component Value Date   TSH 0.61  03/18/2021   Lab Results  Component Value Date   WBC 5.0 03/09/2021   HGB 14.4 03/09/2021   HCT 42.7 03/09/2021   MCV 88.2 03/09/2021   PLT 216 03/09/2021   Lab Results  Component Value Date   NA 139 03/09/2021   K 3.6 03/09/2021   CO2 28 03/09/2021   GLUCOSE 161 (H) 03/09/2021   BUN  10 03/09/2021   CREATININE 0.77 03/09/2021   BILITOT 0.6 12/26/2019   ALKPHOS 63 05/24/2018   AST 18 12/26/2019   ALT 11 12/26/2019   PROT 6.8 12/26/2019   ALBUMIN 4.6 05/24/2018   CALCIUM 9.4 03/09/2021   ANIONGAP 8 03/09/2021   Lab Results  Component Value Date   CHOL 239 (H) 04/05/2020   Lab Results  Component Value Date   HDL 62 04/05/2020   Lab Results  Component Value Date   LDLCALC 158 (H) 04/05/2020   Lab Results  Component Value Date   TRIG 82 04/05/2020   Lab Results  Component Value Date   CHOLHDL 3.9 04/05/2020   No results found for: HGBA1C    Assessment & Plan:   Problem List Items Addressed This Visit       Cardiovascular and Mediastinum   Essential hypertension - Primary    BP stable  134/75. Pt had questions regarding the mechanism of action of her BP medications.  I elaborated in detail MOA of medications and  dicussed the treatment plan with patient. Pt agreed with the plan and demonstrated understanding of the instructions. Continue the current medication amlodipine 5 mg, HCTZ 12.5 mg and Metoprolol 25 mg         Endocrine   Hypothyroidism due to Hashimoto's thyroiditis    Continue levothyroxine 69mcg. Followed by endocrinologist Dr. Ronnald Collum       Relevant Medications   levothyroxine (SYNTHROID) 88 MCG tablet     Other   Rapid heart rate    Stable. Continue Metoprolol. Will continue to monitor.       Constipation    Pt does not want any medication for constipation. She wants natural remedies. Suggested pt to use castor oil.       No orders of the defined types were placed in this encounter.   Follow-up: No follow-ups on  file.    Theresia Lo, NP

## 2021-05-09 ENCOUNTER — Encounter: Payer: Self-pay | Admitting: Nurse Practitioner

## 2021-05-09 DIAGNOSIS — K5900 Constipation, unspecified: Secondary | ICD-10-CM | POA: Insufficient documentation

## 2021-05-09 NOTE — Assessment & Plan Note (Addendum)
BP stable  134/75. Pt had questions regarding the mechanism of action of her BP medications.  I elaborated in detail MOA of medications and  dicussed the treatment plan with patient. Pt agreed with the plan and demonstrated understanding of the instructions. Continue the current medication amlodipine 5 mg, HCTZ 12.5 mg and Metoprolol 25 mg

## 2021-05-09 NOTE — Assessment & Plan Note (Signed)
Pt does not want any medication for constipation. She wants natural remedies. Suggested pt to use castor oil.

## 2021-05-09 NOTE — Assessment & Plan Note (Signed)
Stable. Continue Metoprolol. Will continue to monitor.

## 2021-05-09 NOTE — Assessment & Plan Note (Signed)
Continue levothyroxine 49mcg. Followed by endocrinologist Dr. Ronnald Collum

## 2021-05-13 DIAGNOSIS — I10 Essential (primary) hypertension: Secondary | ICD-10-CM | POA: Diagnosis not present

## 2021-05-13 DIAGNOSIS — E063 Autoimmune thyroiditis: Secondary | ICD-10-CM | POA: Diagnosis not present

## 2021-05-13 DIAGNOSIS — E039 Hypothyroidism, unspecified: Secondary | ICD-10-CM | POA: Diagnosis not present

## 2021-05-13 DIAGNOSIS — R002 Palpitations: Secondary | ICD-10-CM | POA: Diagnosis not present

## 2021-05-15 DIAGNOSIS — R002 Palpitations: Secondary | ICD-10-CM | POA: Diagnosis not present

## 2021-05-15 DIAGNOSIS — E039 Hypothyroidism, unspecified: Secondary | ICD-10-CM | POA: Diagnosis not present

## 2021-05-15 DIAGNOSIS — E063 Autoimmune thyroiditis: Secondary | ICD-10-CM | POA: Diagnosis not present

## 2021-05-15 DIAGNOSIS — I10 Essential (primary) hypertension: Secondary | ICD-10-CM | POA: Diagnosis not present

## 2021-05-19 DIAGNOSIS — R0602 Shortness of breath: Secondary | ICD-10-CM | POA: Diagnosis not present

## 2021-05-19 DIAGNOSIS — R000 Tachycardia, unspecified: Secondary | ICD-10-CM | POA: Diagnosis not present

## 2021-05-19 DIAGNOSIS — R002 Palpitations: Secondary | ICD-10-CM | POA: Diagnosis not present

## 2021-05-27 DIAGNOSIS — R002 Palpitations: Secondary | ICD-10-CM | POA: Diagnosis not present

## 2021-05-27 DIAGNOSIS — E039 Hypothyroidism, unspecified: Secondary | ICD-10-CM | POA: Diagnosis not present

## 2021-05-27 DIAGNOSIS — E063 Autoimmune thyroiditis: Secondary | ICD-10-CM | POA: Diagnosis not present

## 2021-05-29 ENCOUNTER — Other Ambulatory Visit: Payer: Self-pay | Admitting: Internal Medicine

## 2021-05-29 DIAGNOSIS — I10 Essential (primary) hypertension: Secondary | ICD-10-CM | POA: Diagnosis not present

## 2021-05-29 DIAGNOSIS — R002 Palpitations: Secondary | ICD-10-CM | POA: Diagnosis not present

## 2021-05-29 DIAGNOSIS — E063 Autoimmune thyroiditis: Secondary | ICD-10-CM | POA: Diagnosis not present

## 2021-05-29 DIAGNOSIS — E039 Hypothyroidism, unspecified: Secondary | ICD-10-CM | POA: Diagnosis not present

## 2021-06-16 DIAGNOSIS — E039 Hypothyroidism, unspecified: Secondary | ICD-10-CM | POA: Diagnosis not present

## 2021-06-16 DIAGNOSIS — E063 Autoimmune thyroiditis: Secondary | ICD-10-CM | POA: Diagnosis not present

## 2021-06-17 DIAGNOSIS — Z86010 Personal history of colonic polyps: Secondary | ICD-10-CM | POA: Diagnosis not present

## 2021-06-20 DIAGNOSIS — E039 Hypothyroidism, unspecified: Secondary | ICD-10-CM | POA: Diagnosis not present

## 2021-06-20 DIAGNOSIS — E063 Autoimmune thyroiditis: Secondary | ICD-10-CM | POA: Diagnosis not present

## 2021-06-20 DIAGNOSIS — R002 Palpitations: Secondary | ICD-10-CM | POA: Diagnosis not present

## 2021-06-20 DIAGNOSIS — I10 Essential (primary) hypertension: Secondary | ICD-10-CM | POA: Diagnosis not present

## 2021-06-30 DIAGNOSIS — E039 Hypothyroidism, unspecified: Secondary | ICD-10-CM | POA: Diagnosis not present

## 2021-06-30 DIAGNOSIS — R002 Palpitations: Secondary | ICD-10-CM | POA: Diagnosis not present

## 2021-06-30 DIAGNOSIS — E063 Autoimmune thyroiditis: Secondary | ICD-10-CM | POA: Diagnosis not present

## 2021-07-02 DIAGNOSIS — R002 Palpitations: Secondary | ICD-10-CM | POA: Diagnosis not present

## 2021-07-02 DIAGNOSIS — I10 Essential (primary) hypertension: Secondary | ICD-10-CM | POA: Diagnosis not present

## 2021-07-02 DIAGNOSIS — E039 Hypothyroidism, unspecified: Secondary | ICD-10-CM | POA: Diagnosis not present

## 2021-07-02 DIAGNOSIS — E063 Autoimmune thyroiditis: Secondary | ICD-10-CM | POA: Diagnosis not present

## 2021-07-14 DIAGNOSIS — R002 Palpitations: Secondary | ICD-10-CM | POA: Diagnosis not present

## 2021-07-14 DIAGNOSIS — I10 Essential (primary) hypertension: Secondary | ICD-10-CM | POA: Diagnosis not present

## 2021-07-14 DIAGNOSIS — E079 Disorder of thyroid, unspecified: Secondary | ICD-10-CM | POA: Diagnosis not present

## 2021-07-14 DIAGNOSIS — E7800 Pure hypercholesterolemia, unspecified: Secondary | ICD-10-CM | POA: Diagnosis not present

## 2021-07-15 ENCOUNTER — Encounter: Payer: Self-pay | Admitting: General Surgery

## 2021-07-16 ENCOUNTER — Ambulatory Visit
Admission: RE | Admit: 2021-07-16 | Discharge: 2021-07-16 | Disposition: A | Discharge status: Home / Self Care | Payer: Medicare HMO | Source: Ambulatory Visit | Attending: General Surgery | Admitting: General Surgery

## 2021-07-16 ENCOUNTER — Ambulatory Visit: Payer: Medicare HMO

## 2021-07-16 ENCOUNTER — Encounter
Admission: RE | Disposition: A | Discharge status: Home / Self Care | Payer: Self-pay | Source: Ambulatory Visit | Attending: General Surgery

## 2021-07-16 ENCOUNTER — Encounter: Payer: Self-pay | Admitting: General Surgery

## 2021-07-16 ENCOUNTER — Other Ambulatory Visit: Payer: Self-pay

## 2021-07-16 DIAGNOSIS — Z1211 Encounter for screening for malignant neoplasm of colon: Secondary | ICD-10-CM | POA: Insufficient documentation

## 2021-07-16 DIAGNOSIS — E039 Hypothyroidism, unspecified: Secondary | ICD-10-CM | POA: Insufficient documentation

## 2021-07-16 DIAGNOSIS — I10 Essential (primary) hypertension: Secondary | ICD-10-CM | POA: Diagnosis not present

## 2021-07-16 DIAGNOSIS — Z86010 Personal history of colonic polyps: Secondary | ICD-10-CM | POA: Diagnosis not present

## 2021-07-16 DIAGNOSIS — D125 Benign neoplasm of sigmoid colon: Secondary | ICD-10-CM | POA: Insufficient documentation

## 2021-07-16 DIAGNOSIS — K635 Polyp of colon: Secondary | ICD-10-CM | POA: Diagnosis not present

## 2021-07-16 DIAGNOSIS — Z87891 Personal history of nicotine dependence: Secondary | ICD-10-CM | POA: Insufficient documentation

## 2021-07-16 DIAGNOSIS — D126 Benign neoplasm of colon, unspecified: Secondary | ICD-10-CM | POA: Diagnosis not present

## 2021-07-16 DIAGNOSIS — E7800 Pure hypercholesterolemia, unspecified: Secondary | ICD-10-CM | POA: Diagnosis not present

## 2021-07-16 HISTORY — DX: Hypothyroidism, unspecified: E03.9

## 2021-07-16 HISTORY — PX: COLONOSCOPY WITH PROPOFOL: SHX5780

## 2021-07-16 SURGERY — COLONOSCOPY WITH PROPOFOL
Anesthesia: General

## 2021-07-16 MED ORDER — PROPOFOL 10 MG/ML IV BOLUS
INTRAVENOUS | Status: AC
  Filled 2021-07-16: qty 40

## 2021-07-16 MED ORDER — LIDOCAINE HCL (CARDIAC) PF 100 MG/5ML IV SOSY
PREFILLED_SYRINGE | INTRAVENOUS | Status: DC | PRN
  Administered 2021-07-16: 40 mg via INTRAVENOUS

## 2021-07-16 MED ORDER — PROPOFOL 10 MG/ML IV BOLUS
INTRAVENOUS | Status: DC | PRN
  Administered 2021-07-16: 50 mg via INTRAVENOUS
  Administered 2021-07-16: 20 mg via INTRAVENOUS
  Administered 2021-07-16: 10 mg via INTRAVENOUS
  Administered 2021-07-16: 20 mg via INTRAVENOUS
  Administered 2021-07-16: 10 mg via INTRAVENOUS

## 2021-07-16 MED ORDER — PROPOFOL 500 MG/50ML IV EMUL
INTRAVENOUS | Status: AC
  Filled 2021-07-16: qty 50

## 2021-07-16 MED ORDER — SODIUM CHLORIDE 0.9 % IV SOLN: INTRAVENOUS | Status: DC

## 2021-07-16 MED ORDER — PROPOFOL 500 MG/50ML IV EMUL
INTRAVENOUS | Status: DC | PRN
  Administered 2021-07-16: 50 ug/kg/min via INTRAVENOUS

## 2021-07-16 MED ORDER — LIDOCAINE HCL (PF) 2 % IJ SOLN
INTRAMUSCULAR | Status: AC
  Filled 2021-07-16: qty 5

## 2021-07-16 MED ORDER — SEVOFLURANE IN SOLN
RESPIRATORY_TRACT | Status: AC
  Filled 2021-07-16: qty 250

## 2021-07-16 NOTE — H&P (Signed)
Suzanne Ritter ?[id number] ?February 14, 1951 ? ?  ? ?HPI: 71 y/o with a 30-40 mm polyp in the proximal transverse colon in 2013. Resected/ tattooed.  Last endo in 2018 showed miniscule fragments of tubular adenoma without dysplasia. For repeat endoscopy. Tolerated prep well.  ? ?Medications Prior to Admission  ?Medication Sig Dispense Refill Last Dose  ? amLODipine (NORVASC) 5 MG tablet Take 1 tablet (5 mg total) by mouth daily. 90 tablet 1 07/15/2021 at 0800  ? hydrochlorothiazide (HYDRODIURIL) 12.5 MG tablet Take 1 tablet (12.5 mg total) by mouth daily. 90 tablet 1 07/15/2021 at 0800  ? levothyroxine (SYNTHROID) 100 MCG tablet TAKE 1 TABLET(100 MCG) BY MOUTH EVERY DAY 90 tablet 0 07/16/2021 at 0530  ? levothyroxine (SYNTHROID) 88 MCG tablet Take 88 mcg by mouth daily.   07/16/2021 at 0530  ? metoprolol succinate (TOPROL-XL) 25 MG 24 hr tablet TAKE 1 TABLET(25 MG) BY MOUTH DAILY 90 tablet 3   ? ?Allergies  ?Allergen Reactions  ? Naproxen Other (See Comments)  ?  Other Reaction: GI Upset  ? ?Past Medical History:  ?Diagnosis Date  ? Hypertension   ? Hypothyroidism   ? Thyroid disease   ? ?Past Surgical History:  ?Procedure Laterality Date  ? BRAIN SURGERY  2007  ? CERVICAL CONE BIOPSY    ? CESAREAN SECTION    ? COLONOSCOPY WITH PROPOFOL N/A 10/26/2016  ? Procedure: COLONOSCOPY WITH PROPOFOL;  Surgeon: Lollie Sails, MD;  Location: [facility] ENDOSCOPY;  Service: Endoscopy;  Laterality: N/A;  ? THYROID SURGERY    ? ?Social History  ? ?Socioeconomic History  ? Marital status: Married  ?  Spouse name: Not on file  ? Number of children: 2  ? Years of education: Not on file  ? Highest education level: Bachelor's degree (e.g., BA, AB, BS)  ?Occupational History  ? Not on file  ?Tobacco Use  ? Smoking status: Former  ? Smokeless tobacco: Never  ?Vaping Use  ? Vaping Use: Never used  ?Substance and Sexual Activity  ? Alcohol use: No  ? Drug use: No  ? Sexual activity: Yes  ?  Comment: rarely  ?Other Topics Concern  ? Not on file   ?Social History Narrative  ? Not on file  ? ?Social Determinants of Health  ? ?Financial Resource Strain: Low Risk   ? Difficulty of Paying Living Expenses: Not hard at all  ?Food Insecurity: Food Insecurity Present  ? Worried About Charity fundraiser in the Last Year: Sometimes true  ? Ran Out of Food in the Last Year: Sometimes true  ?Transportation Needs: No Transportation Needs  ? Lack of Transportation (Medical): No  ? Lack of Transportation (Non-Medical): No  ?Physical Activity: Insufficiently Active  ? Days of Exercise per Week: 4 days  ? Minutes of Exercise per Session: 30 min  ?Stress: No Stress Concern Present  ? Feeling of Stress : Not at all  ?Social Connections: Moderately Integrated  ? Frequency of Communication with Friends and Family: More than three times a week  ? Frequency of Social Gatherings with Friends and Family: More than three times a week  ? Attends Religious Services: More than 4 times per year  ? Active Member of Clubs or Organizations: No  ? Attends Archivist Meetings: Never  ? Marital Status: Married  ?Intimate Partner Violence: Not At Risk  ? Fear of Current or Ex-Partner: No  ? Emotionally Abused: No  ? Physically Abused: No  ? Sexually Abused: No  ? ?  Social History  ? ?Social History Narrative  ? Not on file  ? ? ? ?ROS: Negative.  ? ? ? ?PE: ?HEENT: Negative. ?Lungs: Clear. ?Cardio: RR. ? ? ?Assessment/Plan: ? ?Proceed with planned lower endoscopy.  ?Robert Bellow ?07/16/2021 ? ?  ?

## 2021-07-16 NOTE — Anesthesia Preprocedure Evaluation (Signed)
Anesthesia Evaluation  ?Patient identified by MRN, date of birth, ID band ?Patient awake ? ? ? ?Reviewed: ?Allergy & Precautions, NPO status , Patient's Chart, lab work & pertinent test results ? ?History of Anesthesia Complications ?Negative for: history of anesthetic complications ? ?Airway ?Mallampati: II ? ?TM Distance: >3 FB ?Neck ROM: full ? ? ? Dental ? ?(+) Chipped ?  ?Pulmonary ?neg shortness of breath, former smoker,  ?  ?Pulmonary exam normal ? ? ? ? ? ? ? Cardiovascular ?Exercise Tolerance: Good ?hypertension, (-) anginaNormal cardiovascular exam ? ? ?  ?Neuro/Psych ?negative neurological ROS ? negative psych ROS  ? GI/Hepatic ?negative GI ROS, Neg liver ROS, neg GERD  ,  ?Endo/Other  ?Hypothyroidism  ? Renal/GU ?negative Renal ROS  ?negative genitourinary ?  ?Musculoskeletal ? ? Abdominal ?  ?Peds ? Hematology ?negative hematology ROS ?(+)   ?Anesthesia Other Findings ?Past Medical History: ?No date: Hypertension ?No date: Hypothyroidism ?No date: Thyroid disease ? ?Past Surgical History: ?2007: BRAIN SURGERY ?No date: CERVICAL CONE BIOPSY ?No date: CESAREAN SECTION ?10/26/2016: COLONOSCOPY WITH PROPOFOL; N/A ?    Comment:  Procedure: COLONOSCOPY WITH PROPOFOL;  Surgeon:  ?             Lollie Sails, MD;  Location: [facility] ENDOSCOPY;   ?             Service: Endoscopy;  Laterality: N/A; ?No date: THYROID SURGERY ? ?BMI   ? Body Mass Index: 20.37 kg/m?  ?  ? ? Reproductive/Obstetrics ?negative OB ROS ? ?  ? ? ? ? ? ? ? ? ? ? ? ? ? ?  ?  ? ? ? ? ? ? ? ? ?Anesthesia Physical ?Anesthesia Plan ? ?ASA: 3 ? ?Anesthesia Plan: General  ? ?Post-op Pain Management:   ? ?Induction: Intravenous ? ?PONV Risk Score and Plan: Propofol infusion and TIVA ? ?Airway Management Planned: Natural Airway and Nasal Cannula ? ?Additional Equipment:  ? ?Intra-op Plan:  ? ?Post-operative Plan:  ? ?Informed Consent: I have reviewed the patients History and Physical, chart, labs and discussed the  procedure including the risks, benefits and alternatives for the proposed anesthesia with the patient or authorized representative who has indicated his/her understanding and acceptance.  ? ? ? ?Dental Advisory Given ? ?Plan Discussed with: Anesthesiologist, CRNA and Surgeon ? ?Anesthesia Plan Comments: (Patient consented for risks of anesthesia including but not limited to:  ?- adverse reactions to medications ?- risk of airway placement if required ?- damage to eyes, teeth, lips or other oral mucosa ?- nerve damage due to positioning  ?- sore throat or hoarseness ?- Damage to heart, brain, nerves, lungs, other parts of body or loss of life ? ?Patient voiced understanding.)  ? ? ? ? ? ? ?Anesthesia Quick Evaluation ? ?

## 2021-07-16 NOTE — Op Note (Signed)
[facility] ?Gastroenterology ?Patient Name: Suzanne Ritter ?Procedure Date: 07/16/2021 7:18 AM ?MRN: [id number] ?Account #: [id number] ?Date of Birth: Jul 18, 1950 ?Admit Type: Outpatient ?Age: 71 ?Room: [facility] ENDO ROOM 1 ?Gender: Female ?Note Status: Finalized ?Instrument Name: Peds Colonoscope [id number] ?Procedure:             Colonoscopy ?Indications:           High risk colon cancer surveillance: Personal history  ?                       of colonic polyps ?Providers:             Robert Bellow, MD ?Medicines:             Propofol per Anesthesia ?Complications:         No immediate complications. ?Procedure:             Pre-Anesthesia Assessment: ?                       - Prior to the procedure, a History and Physical was  ?                       performed, and patient medications, allergies and  ?                       sensitivities were reviewed. The patient's tolerance  ?                       of previous anesthesia was reviewed. ?                       - The risks and benefits of the procedure and the  ?                       sedation options and risks were discussed with the  ?                       patient. All questions were answered and informed  ?                       consent was obtained. ?                       After obtaining informed consent, the colonoscope was  ?                       passed under direct vision. Throughout the procedure,  ?                       the patient's blood pressure, pulse, and oxygen  ?                       saturations were monitored continuously. The  ?                       Colonoscope was introduced through the anus and  ?                       advanced to the the cecum, identified by appendiceal  ?  orifice and ileocecal valve. The colonoscopy was  ?                       performed with moderate difficulty due to a tortuous  ?                       colon. Successful completion of the procedure was  ?                        aided by using manual pressure. The quality of the  ?                       bowel preparation was excellent. ?Findings: ?     A 5 mm polyp was found in the sigmoid colon. The polyp was sessile.  ?     Biopsies were taken with a cold forceps for histology. ?     A tattoo was seen in the proximal transverse colon. A post-polypectomy  ?     scar was found at the tattoo site. ?     The retroflexed view of the distal rectum and anal verge was normal and  ?     showed no anal or rectal abnormalities. ?Impression:            - One 5 mm polyp in the sigmoid colon. Biopsied. ?                       - A tattoo was seen in the proximal transverse colon.  ?                       A post-polypectomy scar was found at the tattoo site. ?                       - The distal rectum and anal verge are normal on  ?                       retroflexion view. ?Recommendation:        - Repeat colonoscopy in 5 years for surveillance. ?Procedure Code(s):     --- Professional --- ?                       [phone], Colonoscopy, flexible; with biopsy, single or  ?                       multiple ?Diagnosis Code(s):     --- Professional --- ?                       Z86.010, Personal history of colonic polyps ?                       K63.5, Polyp of colon ?CPT copyright 2019 American Medical Association. All rights reserved. ?The codes documented in this report are preliminary and upon coder review may  ?be revised to meet current compliance requirements. ?Robert Bellow, MD ?07/16/2021 8:10:07 AM ?This report has been signed electronically. ?Number of Addenda: 0 ?Note Initiated On: 07/16/2021 7:18 AM ?Scope Withdrawal Time: 0 hours 10 minutes 43 seconds  ?Total Procedure Duration: 0 hours 23 minutes 30 seconds  ?Estimated Blood Loss:  Estimated blood loss: none. ?  [facility] ?

## 2021-07-16 NOTE — Anesthesia Postprocedure Evaluation (Signed)
Anesthesia Post Note ? ?Patient: Suzanne Ritter ? ?Procedure(s) Performed: COLONOSCOPY WITH PROPOFOL ? ?Patient location during evaluation: Endoscopy ?Anesthesia Type: General ?Level of consciousness: awake and alert ?Pain management: pain level controlled ?Vital Signs Assessment: post-procedure vital signs reviewed and stable ?Respiratory status: spontaneous breathing, nonlabored ventilation, respiratory function stable and patient connected to nasal cannula oxygen ?Cardiovascular status: blood pressure returned to baseline and stable ?Postop Assessment: no apparent nausea or vomiting ?Anesthetic complications: no ? ? ?No notable events documented. ? ? ?Last Vitals:  ?Vitals:  ? 07/16/21 0822 07/16/21 0832  ?BP: 113/80 125/81  ?Pulse: 66 68  ?Resp: 17 15  ?Temp:    ?SpO2: 100% 100%  ?  ?Last Pain:  ?Vitals:  ? 07/16/21 0832  ?TempSrc:   ?PainSc: 0-No pain  ? ? ?  ?  ?  ?  ?  ?  ? ?Suzanne Ritter ? ? ? ? ?

## 2021-07-16 NOTE — Transfer of Care (Signed)
Immediate Anesthesia Transfer of Care Note ? ?Patient: Suzanne Ritter ? ?Procedure(s) Performed: COLONOSCOPY WITH PROPOFOL ? ?Patient Location: PACU and Endoscopy Unit ? ?Anesthesia Type:General ? ?Level of Consciousness: awake, oriented and drowsy ? ?Airway & Oxygen Therapy: Patient Spontanous Breathing ? ?Post-op Assessment: Report given to RN, Post -op Vital signs reviewed and stable and Patient moving all extremities X 4 ? ?Post vital signs: Reviewed and stable ? ?Last Vitals:  ?Vitals Value Taken Time  ?BP 111/80 07/16/21 0813  ?Temp 36.1 ?C 07/16/21 0812  ?Pulse 62 07/16/21 0816  ?Resp 14 07/16/21 0816  ?SpO2 100 % 07/16/21 0816  ?Vitals shown include unvalidated device data. ? ?Last Pain:  ?Vitals:  ? 07/16/21 0812  ?TempSrc: Tympanic  ?PainSc: Asleep  ?   ? ?  ? ?Complications: No notable events documented. ?

## 2021-07-17 ENCOUNTER — Encounter: Payer: Self-pay | Admitting: General Surgery

## 2021-07-17 LAB — SURGICAL PATHOLOGY

## 2021-08-05 ENCOUNTER — Ambulatory Visit: Payer: Medicare HMO | Admitting: Internal Medicine

## 2021-08-06 DIAGNOSIS — E063 Autoimmune thyroiditis: Secondary | ICD-10-CM | POA: Diagnosis not present

## 2021-08-06 DIAGNOSIS — E039 Hypothyroidism, unspecified: Secondary | ICD-10-CM | POA: Diagnosis not present

## 2021-08-07 ENCOUNTER — Ambulatory Visit (INDEPENDENT_AMBULATORY_CARE_PROVIDER_SITE_OTHER): Payer: Medicare HMO | Admitting: Nurse Practitioner

## 2021-08-07 ENCOUNTER — Encounter: Payer: Self-pay | Admitting: Nurse Practitioner

## 2021-08-07 VITALS — BP 134/86 | HR 82 | Ht 63.0 in | Wt 116.9 lb

## 2021-08-07 DIAGNOSIS — K5900 Constipation, unspecified: Secondary | ICD-10-CM

## 2021-08-07 DIAGNOSIS — E038 Other specified hypothyroidism: Secondary | ICD-10-CM

## 2021-08-07 DIAGNOSIS — I10 Essential (primary) hypertension: Secondary | ICD-10-CM | POA: Diagnosis not present

## 2021-08-07 DIAGNOSIS — E063 Autoimmune thyroiditis: Secondary | ICD-10-CM | POA: Diagnosis not present

## 2021-08-07 NOTE — Assessment & Plan Note (Signed)
Stable at present.   

## 2021-08-07 NOTE — Progress Notes (Signed)
? ?Established Patient Office Visit ? ?Subjective:  ?Patient ID: Suzanne Ritter, female    DOB: 1950-04-21  Age: 71 y.o. MRN: [id number] ? ?CC:  ?Chief Complaint  ?Patient presents with  ? Follow-up  ? ? ? ?HPI ? ?Suzanne Ritter presents for follow up on hypertension. She is no longer taking metoprolol. She is follwed by Dr. Ronnald Collum  for hypothyroidism and had reduced the synthroid dosage to 25 mg. She feels little sluggish.  ? ? ?HPI  ? ?Past Medical History:  ?Diagnosis Date  ? Hypertension   ? Hypothyroidism   ? Thyroid disease   ? ? ?Past Surgical History:  ?Procedure Laterality Date  ? BRAIN SURGERY  2007  ? CERVICAL CONE BIOPSY    ? CESAREAN SECTION    ? COLONOSCOPY WITH PROPOFOL N/A 10/26/2016  ? Procedure: COLONOSCOPY WITH PROPOFOL;  Surgeon: Lollie Sails, MD;  Location: [facility] ENDOSCOPY;  Service: Endoscopy;  Laterality: N/A;  ? COLONOSCOPY WITH PROPOFOL N/A 07/16/2021  ? Procedure: COLONOSCOPY WITH PROPOFOL;  Surgeon: Robert Bellow, MD;  Location: [facility] ENDOSCOPY;  Service: Endoscopy;  Laterality: N/A;  ? THYROID SURGERY    ? ? ?Family History  ?Problem Relation Age of Onset  ? Cancer Mother   ?     kidney  ? ? ?Social History  ? ?Socioeconomic History  ? Marital status: Married  ?  Spouse name: Not on file  ? Number of children: 2  ? Years of education: Not on file  ? Highest education level: Bachelor's degree (e.g., BA, AB, BS)  ?Occupational History  ? Not on file  ?Tobacco Use  ? Smoking status: Former  ? Smokeless tobacco: Never  ?Vaping Use  ? Vaping Use: Never used  ?Substance and Sexual Activity  ? Alcohol use: No  ? Drug use: No  ? Sexual activity: Yes  ?  Comment: rarely  ?Other Topics Concern  ? Not on file  ?Social History Narrative  ? Not on file  ? ?Social Determinants of Health  ? ?Financial Resource Strain: Low Risk   ? Difficulty of Paying Living Expenses: Not hard at all  ?Food Insecurity: Food Insecurity Present  ? Worried About Charity fundraiser in the Last Year:  Sometimes true  ? Ran Out of Food in the Last Year: Sometimes true  ?Transportation Needs: No Transportation Needs  ? Lack of Transportation (Medical): No  ? Lack of Transportation (Non-Medical): No  ?Physical Activity: Insufficiently Active  ? Days of Exercise per Week: 4 days  ? Minutes of Exercise per Session: 30 min  ?Stress: No Stress Concern Present  ? Feeling of Stress : Not at all  ?Social Connections: Moderately Integrated  ? Frequency of Communication with Friends and Family: More than three times a week  ? Frequency of Social Gatherings with Friends and Family: More than three times a week  ? Attends Religious Services: More than 4 times per year  ? Active Member of Clubs or Organizations: No  ? Attends Archivist Meetings: Never  ? Marital Status: Married  ?Intimate Partner Violence: Not At Risk  ? Fear of Current or Ex-Partner: No  ? Emotionally Abused: No  ? Physically Abused: No  ? Sexually Abused: No  ? ? ? ?Outpatient Medications Prior to Visit  ?Medication Sig Dispense Refill  ? amLODipine (NORVASC) 5 MG tablet Take 1 tablet (5 mg total) by mouth daily. 90 tablet 1  ? hydrochlorothiazide (HYDRODIURIL) 12.5 MG tablet Take 1 tablet (12.5  mg total) by mouth daily. 90 tablet 1  ? levothyroxine (SYNTHROID) 100 MCG tablet TAKE 1 TABLET(100 MCG) BY MOUTH EVERY DAY 90 tablet 0  ? levothyroxine (SYNTHROID) 88 MCG tablet Take 88 mcg by mouth daily.    ? metoprolol succinate (TOPROL-XL) 25 MG 24 hr tablet TAKE 1 TABLET(25 MG) BY MOUTH DAILY 90 tablet 3  ? SYNTHROID 25 MCG tablet Take 25 mcg by mouth daily.    ? ?No facility-administered medications prior to visit.  ? ? ?Allergies  ?Allergen Reactions  ? Naproxen Other (See Comments)  ?  Other Reaction: GI Upset  ? ? ?ROS ?Review of Systems  ?Constitutional:  Negative for activity change and fatigue.  ?HENT:  Negative for congestion, mouth sores and sore throat.   ?Eyes:  Negative for photophobia and discharge.  ?Respiratory:  Negative for chest  tightness, shortness of breath and wheezing.   ?Cardiovascular:  Negative for chest pain and palpitations.  ?Gastrointestinal:  Negative for abdominal distention and blood in stool.  ?Genitourinary:  Negative for difficulty urinating and frequency.  ?Musculoskeletal:  Negative for arthralgias and joint swelling.  ?Skin:  Negative for color change, pallor and rash.  ?Neurological:  Negative for dizziness, facial asymmetry and headaches.  ?Psychiatric/Behavioral:  Negative for agitation, behavioral problems and confusion.   ? ?  ?Objective:  ?  ?Physical Exam ?Constitutional:   ?   Appearance: Normal appearance. She is normal weight.  ?HENT:  ?   Head: Normocephalic and atraumatic.  ?   Right Ear: Tympanic membrane normal.  ?   Left Ear: Tympanic membrane normal.  ?   Nose: Nose normal.  ?   Mouth/Throat:  ?   Mouth: Mucous membranes are moist.  ?   Pharynx: Oropharynx is clear.  ?Eyes:  ?   Extraocular Movements: Extraocular movements intact.  ?   Conjunctiva/sclera: Conjunctivae normal.  ?   Pupils: Pupils are equal, round, and reactive to light.  ?Cardiovascular:  ?   Rate and Rhythm: Normal rate and regular rhythm.  ?   Pulses: Normal pulses.  ?   Heart sounds: Normal heart sounds.  ?Pulmonary:  ?   Effort: Pulmonary effort is normal.  ?   Breath sounds: Normal breath sounds.  ?Abdominal:  ?   General: Abdomen is flat. Bowel sounds are normal.  ?   Palpations: Abdomen is soft.  ?Musculoskeletal:     ?   General: Normal range of motion.  ?   Cervical back: Normal range of motion.  ?Skin: ?   General: Skin is warm.  ?   Capillary Refill: Capillary refill takes less than 2 seconds.  ?Neurological:  ?   General: No focal deficit present.  ?   Mental Status: She is alert and oriented to person, place, and time. Mental status is at baseline.  ?Psychiatric:     ?   Mood and Affect: Mood normal.     ?   Thought Content: Thought content normal.     ?   Judgment: Judgment normal.  ? ? ?BP 134/86   Pulse 82   Ht '5\' 3"'$  (1.6  m)   Wt 116 lb 14.4 oz (53 kg)   BMI 20.71 kg/m?  ?Wt Readings from Last 3 Encounters:  ?08/07/21 116 lb 14.4 oz (53 kg)  ?07/16/21 115 lb (52.2 kg)  ?05/08/21 116 lb 1.6 oz (52.7 kg)  ? ? ? ?Health Maintenance Due  ?Topic Date Due  ? Hepatitis C Screening  Never done  ?  MAMMOGRAM  Never done  ? Zoster Vaccines- Shingrix (1 of 2) Never done  ? COVID-19 Vaccine (3 - Booster for Pfizer series) 03/26/2020  ? ? ?There are no preventive care reminders to display for this patient. ? ?Lab Results  ?Component Value Date  ? TSH 0.61 03/18/2021  ? ?Lab Results  ?Component Value Date  ? WBC 5.0 03/09/2021  ? HGB 14.4 03/09/2021  ? HCT 42.7 03/09/2021  ? MCV 88.2 03/09/2021  ? PLT 216 03/09/2021  ? ?Lab Results  ?Component Value Date  ? NA 139 03/09/2021  ? K 3.6 03/09/2021  ? CO2 28 03/09/2021  ? GLUCOSE 161 (H) 03/09/2021  ? BUN 10 03/09/2021  ? CREATININE 0.77 03/09/2021  ? BILITOT 0.6 12/26/2019  ? ALKPHOS 63 05/24/2018  ? AST 18 12/26/2019  ? ALT 11 12/26/2019  ? PROT 6.8 12/26/2019  ? ALBUMIN 4.6 05/24/2018  ? CALCIUM 9.4 03/09/2021  ? ANIONGAP 8 03/09/2021  ? ?Lab Results  ?Component Value Date  ? CHOL 239 (H) 04/05/2020  ? ?Lab Results  ?Component Value Date  ? HDL 62 04/05/2020  ? ?Lab Results  ?Component Value Date  ? LDLCALC 158 (H) 04/05/2020  ? ?Lab Results  ?Component Value Date  ? TRIG 82 04/05/2020  ? ?Lab Results  ?Component Value Date  ? CHOLHDL 3.9 04/05/2020  ? ?No results found for: HGBA1C ? ?  ?Assessment & Plan:  ? ?Problem List Items Addressed This Visit   ? ?  ? Cardiovascular and Mediastinum  ? Essential hypertension - Primary  ?  Patient BP 134/86 in the office today. ?Advised pt to follow a low sodium and heart healthy diet. ?Continue amlodipine and HCTZ. ? ?  ?  ?  ? Endocrine  ? Hypothyroidism due to Hashimoto's thyroiditis  ?  Followed by endocrinologist.  ?Continue Synthroid 25 mg  ? ?  ?  ? Relevant Medications  ? SYNTHROID 25 MCG tablet  ?  ? Other  ? Constipation  ?  Stable at present.  ? ?   ?  ? ? ? ?No orders of the defined types were placed in this encounter. ? ? ? ?Follow-up: No follow-ups on file.  ? ? ?Theresia Lo, NP ?

## 2021-08-07 NOTE — Assessment & Plan Note (Signed)
Patient BP 134/86 in the office today. ?Advised pt to follow a low sodium and heart healthy diet. ?Continue amlodipine and HCTZ. ? ?

## 2021-08-07 NOTE — Assessment & Plan Note (Signed)
Followed by endocrinologist.  ?Continue Synthroid 25 mg  ?

## 2021-08-13 DIAGNOSIS — E063 Autoimmune thyroiditis: Secondary | ICD-10-CM | POA: Diagnosis not present

## 2021-08-13 DIAGNOSIS — E039 Hypothyroidism, unspecified: Secondary | ICD-10-CM | POA: Diagnosis not present

## 2021-08-13 DIAGNOSIS — R002 Palpitations: Secondary | ICD-10-CM | POA: Diagnosis not present

## 2021-08-13 DIAGNOSIS — I10 Essential (primary) hypertension: Secondary | ICD-10-CM | POA: Diagnosis not present

## 2021-09-04 ENCOUNTER — Other Ambulatory Visit: Payer: Self-pay | Admitting: Internal Medicine

## 2021-09-06 DIAGNOSIS — E039 Hypothyroidism, unspecified: Secondary | ICD-10-CM | POA: Diagnosis not present

## 2021-09-06 DIAGNOSIS — R5383 Other fatigue: Secondary | ICD-10-CM | POA: Diagnosis not present

## 2021-11-07 ENCOUNTER — Ambulatory Visit: Payer: Medicare HMO | Admitting: Nurse Practitioner

## 2021-12-30 ENCOUNTER — Other Ambulatory Visit: Payer: Self-pay | Admitting: Internal Medicine

## 2021-12-30 DIAGNOSIS — Z1231 Encounter for screening mammogram for malignant neoplasm of breast: Secondary | ICD-10-CM

## 2021-12-31 ENCOUNTER — Other Ambulatory Visit: Payer: Self-pay | Admitting: Internal Medicine

## 2021-12-31 DIAGNOSIS — Z13820 Encounter for screening for osteoporosis: Secondary | ICD-10-CM

## 2022-01-07 ENCOUNTER — Other Ambulatory Visit: Payer: Self-pay | Admitting: Internal Medicine

## 2022-01-07 DIAGNOSIS — E890 Postprocedural hypothyroidism: Secondary | ICD-10-CM

## 2022-01-09 ENCOUNTER — Ambulatory Visit
Admission: RE | Admit: 2022-01-09 | Discharge: 2022-01-09 | Disposition: A | Discharge status: Home / Self Care | Payer: Medicare HMO | Source: Ambulatory Visit | Attending: Internal Medicine | Admitting: Internal Medicine

## 2022-01-09 DIAGNOSIS — Z13820 Encounter for screening for osteoporosis: Secondary | ICD-10-CM

## 2022-01-09 DIAGNOSIS — Z1231 Encounter for screening mammogram for malignant neoplasm of breast: Secondary | ICD-10-CM

## 2022-05-21 IMAGING — US US THYROID
1 series · 14 of 22 positions shown · non-contrast
Comparison: October 2013

CLINICAL DATA: Hypothyroid.

EXAM:
THYROID ULTRASOUND
TECHNIQUE: Ultrasound examination of the thyroid gland and adjacent soft
tissues was performed.

[Series 1: us thyroid · 0.07mm/px · 22 acquisitions, 14 frames shown]
[im 1/22]
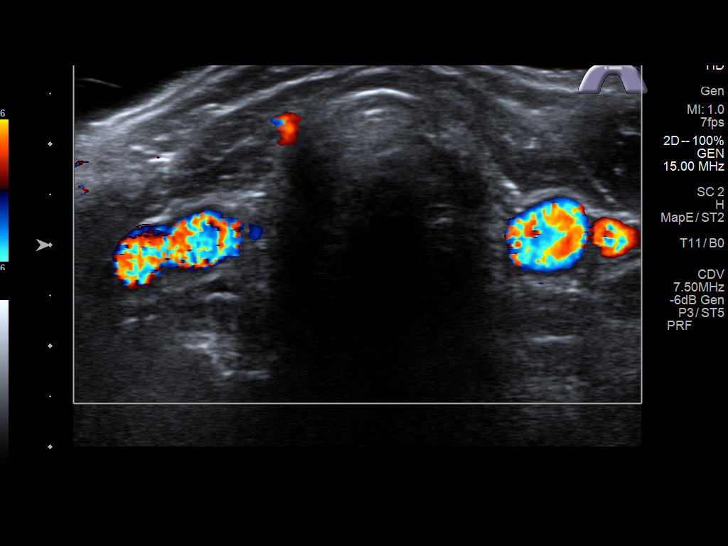
[im 3/22]
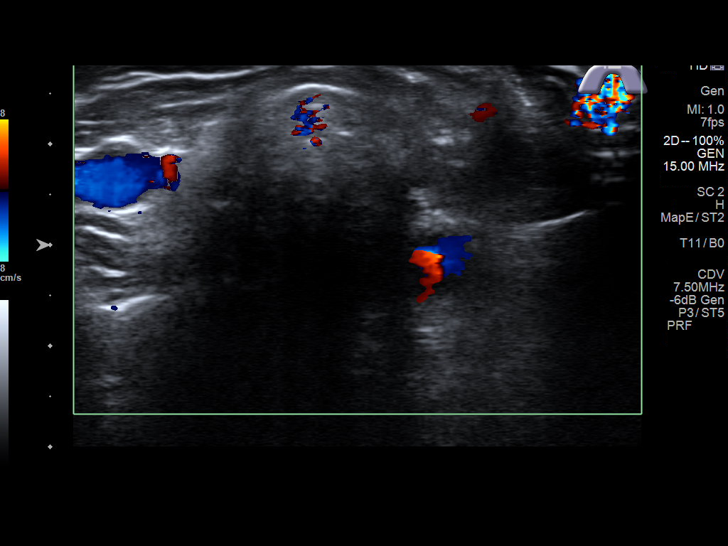
[im 4/22]
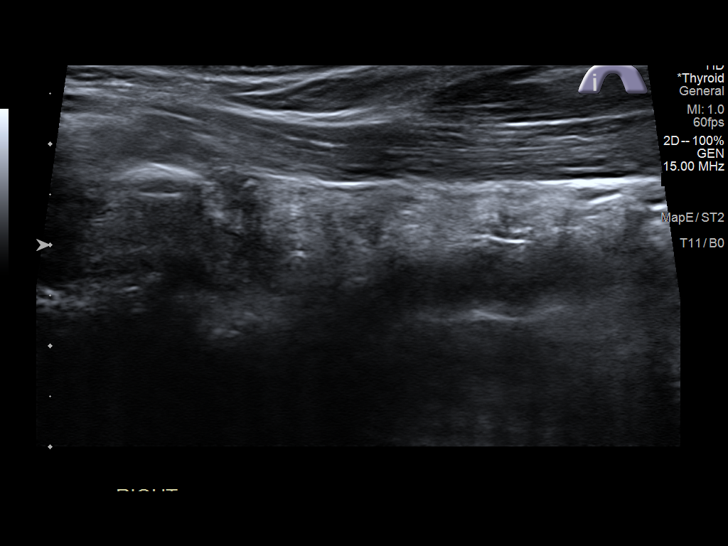
[im 6/22]
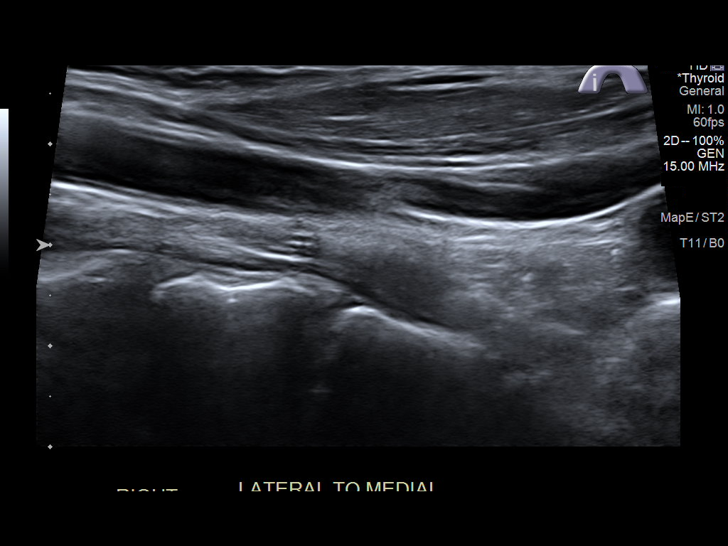
[im 8/22]
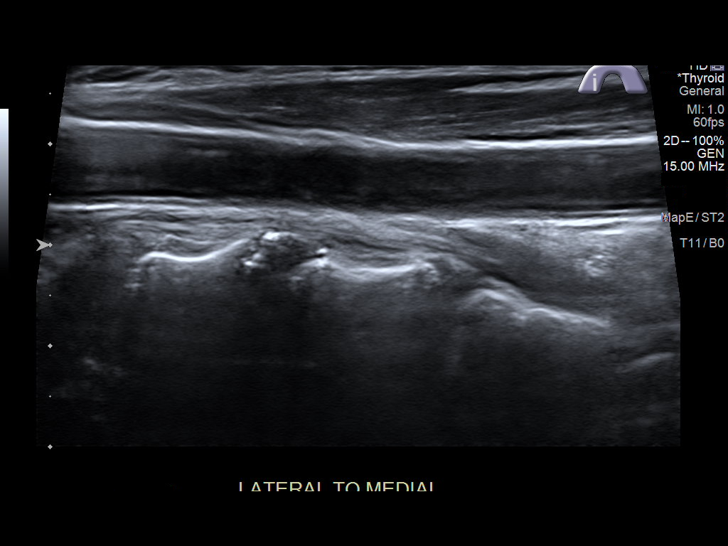
[im 9/22]
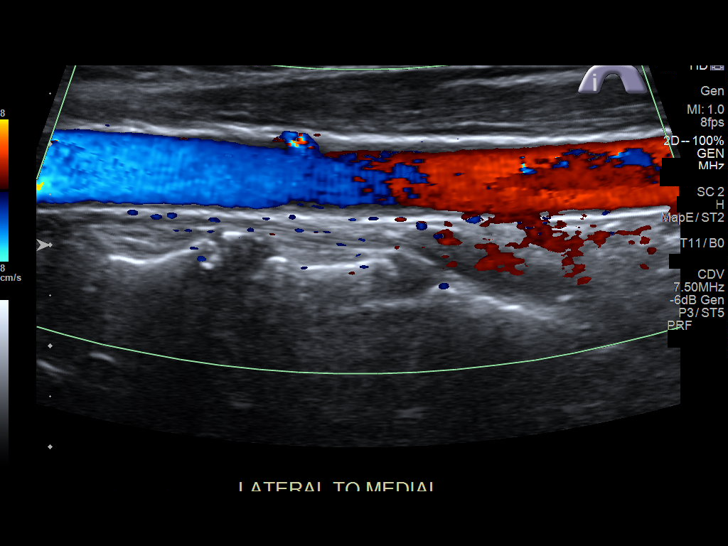
[im 11/22]
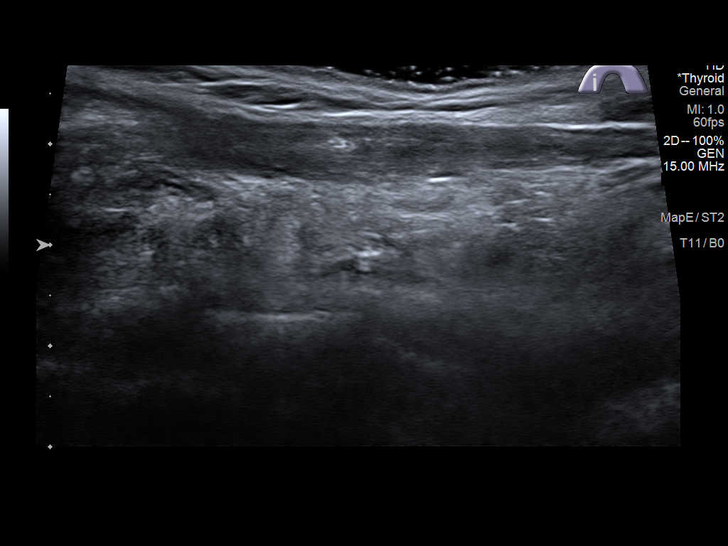
[im 12/22]
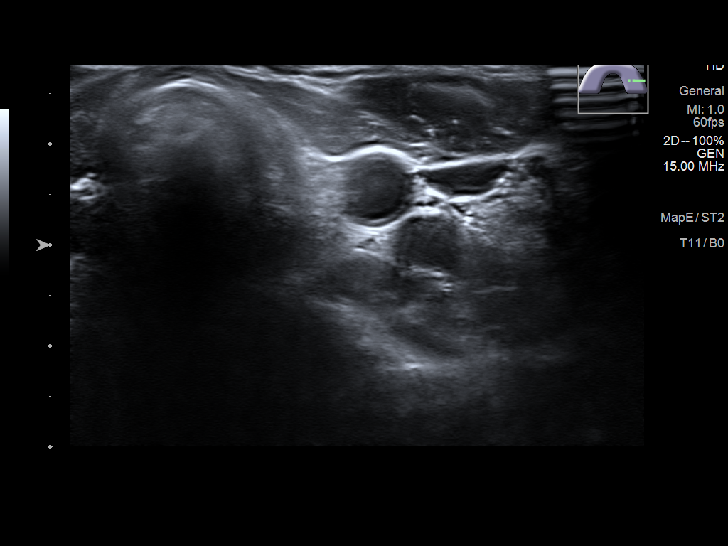
[im 14/22]
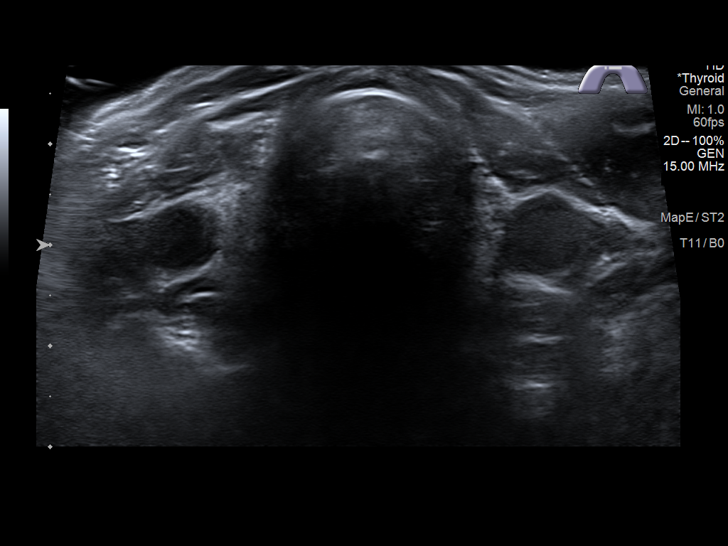
[im 15/22]
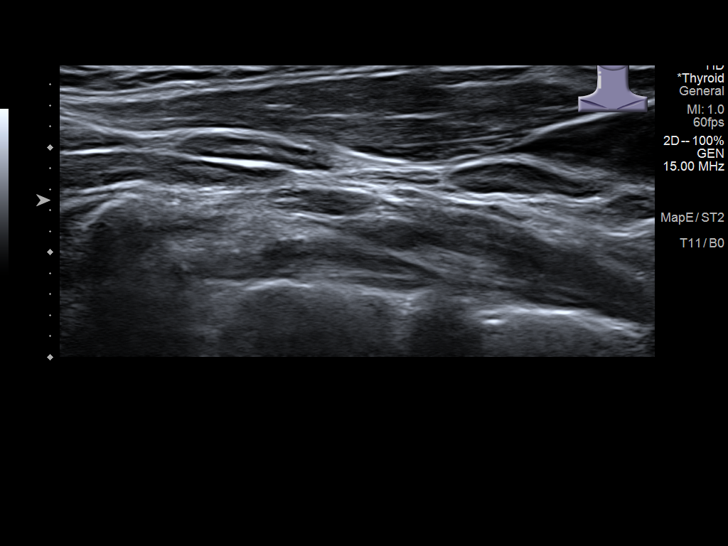
[im 17/22]
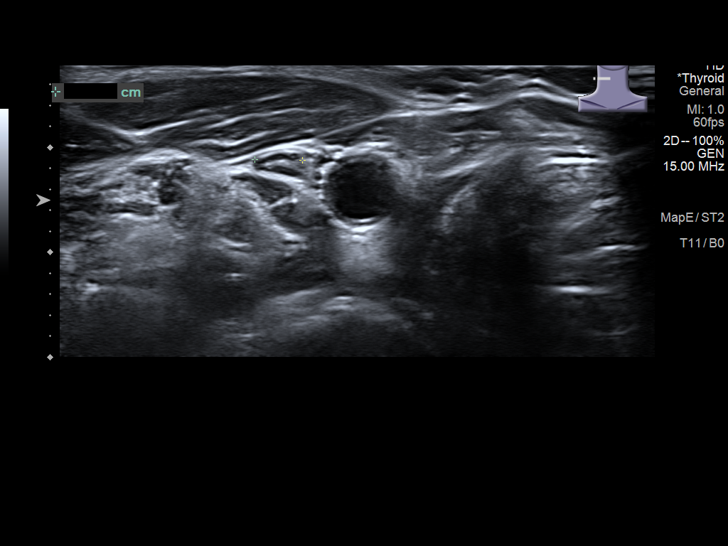
[im 19/22]
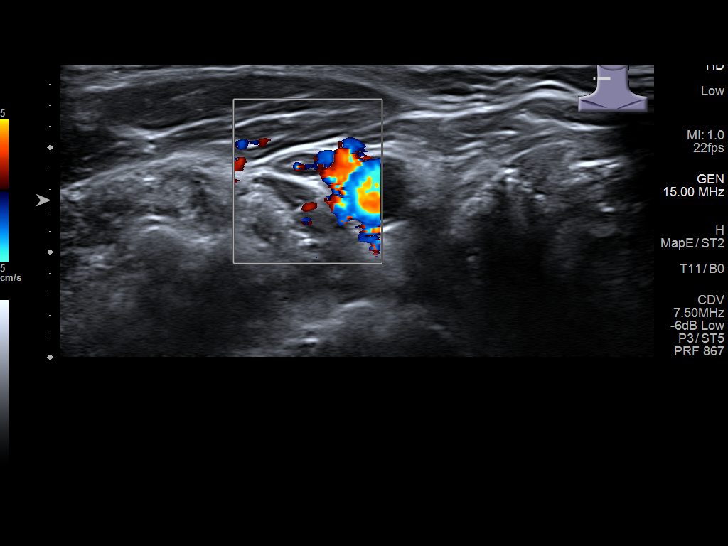
[im 20/22]
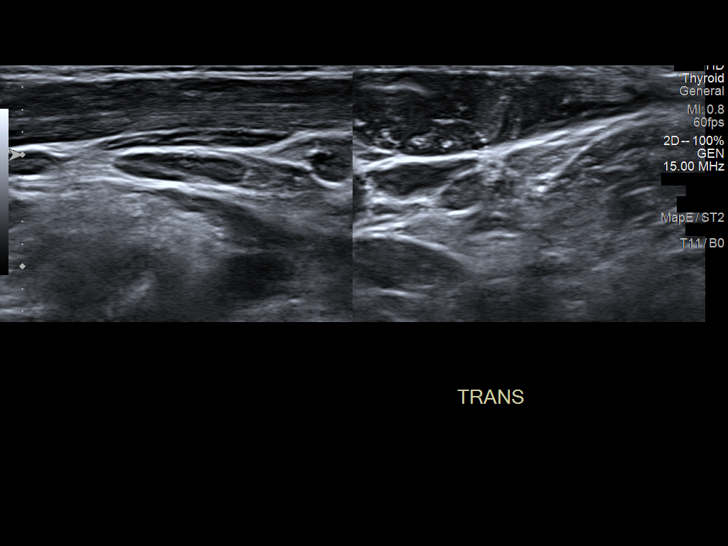
[im 22/22]
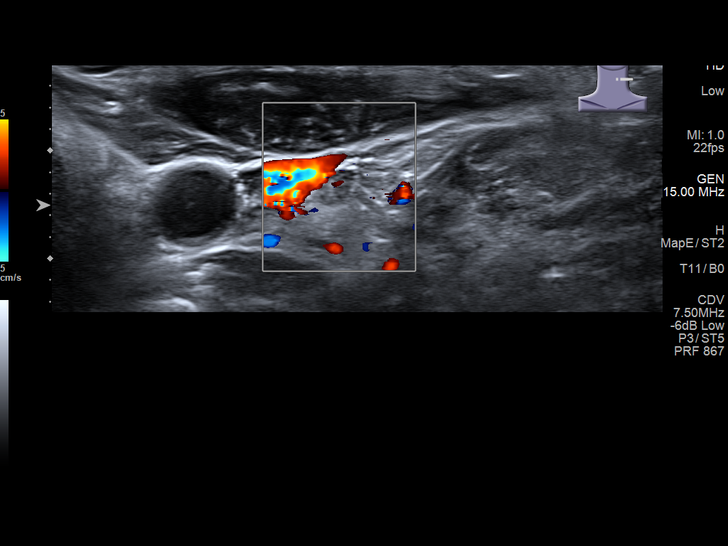

[14 of 22 positions shown; findings below may reference images not displayed]

FINDINGS: The thyroid is surgically absent. No abnormal soft tissue or
nodularity is identified within the surgical resection bed. A couple
small normal-appearing lymph nodes are identified.
IMPRESSION: Status post total thyroidectomy without findings of recurrent
locoregional disease.

## 2022-11-23 ENCOUNTER — Other Ambulatory Visit: Payer: Self-pay | Admitting: Internal Medicine

## 2022-11-23 DIAGNOSIS — E782 Mixed hyperlipidemia: Secondary | ICD-10-CM

## 2022-11-23 DIAGNOSIS — I10 Essential (primary) hypertension: Secondary | ICD-10-CM

## 2022-12-09 ENCOUNTER — Ambulatory Visit
Admission: RE | Admit: 2022-12-09 | Discharge: 2022-12-09 | Disposition: A | Discharge status: Home / Self Care | Payer: Medicare HMO | Source: Ambulatory Visit | Attending: Internal Medicine | Admitting: Internal Medicine

## 2022-12-09 DIAGNOSIS — I10 Essential (primary) hypertension: Secondary | ICD-10-CM | POA: Insufficient documentation

## 2022-12-09 DIAGNOSIS — E782 Mixed hyperlipidemia: Secondary | ICD-10-CM | POA: Insufficient documentation

## 2023-03-29 ENCOUNTER — Other Ambulatory Visit: Payer: Self-pay | Admitting: Internal Medicine

## 2023-03-29 DIAGNOSIS — Z1231 Encounter for screening mammogram for malignant neoplasm of breast: Secondary | ICD-10-CM

## 2023-04-15 ENCOUNTER — Ambulatory Visit
Admission: RE | Admit: 2023-04-15 | Discharge: 2023-04-15 | Disposition: A | Discharge status: Home / Self Care | Payer: Medicare HMO | Source: Ambulatory Visit | Attending: Internal Medicine | Admitting: Internal Medicine

## 2023-04-15 DIAGNOSIS — Z1231 Encounter for screening mammogram for malignant neoplasm of breast: Secondary | ICD-10-CM

## 2024-01-10 ENCOUNTER — Other Ambulatory Visit: Payer: Self-pay | Admitting: Internal Medicine

## 2024-01-10 DIAGNOSIS — Z780 Asymptomatic menopausal state: Secondary | ICD-10-CM

## 2024-05-02 ENCOUNTER — Other Ambulatory Visit: Payer: Self-pay | Admitting: Internal Medicine

## 2024-05-02 DIAGNOSIS — Z1231 Encounter for screening mammogram for malignant neoplasm of breast: Secondary | ICD-10-CM

## 2024-05-30 ENCOUNTER — Encounter

## 2024-05-30 ENCOUNTER — Other Ambulatory Visit
